# Patient Record
Sex: Male | Born: 1960 | Race: White | Hispanic: No | Smoking: Current some day smoker
Health system: Southern US, Community
[De-identification: ages and names within clinical notes are randomized; demographics above are authoritative.]

## PROBLEM LIST (undated history)

## (undated) DIAGNOSIS — T8459XA Infection and inflammatory reaction due to other internal joint prosthesis, initial encounter: Secondary | ICD-10-CM

## (undated) DIAGNOSIS — E785 Hyperlipidemia, unspecified: Secondary | ICD-10-CM

## (undated) DIAGNOSIS — K635 Polyp of colon: Secondary | ICD-10-CM

## (undated) DIAGNOSIS — I712 Thoracic aortic aneurysm, without rupture, unspecified: Secondary | ICD-10-CM

## (undated) DIAGNOSIS — I10 Essential (primary) hypertension: Secondary | ICD-10-CM

## (undated) DIAGNOSIS — E0590 Thyrotoxicosis, unspecified without thyrotoxic crisis or storm: Secondary | ICD-10-CM

## (undated) DIAGNOSIS — E119 Type 2 diabetes mellitus without complications: Secondary | ICD-10-CM

## (undated) DIAGNOSIS — I493 Ventricular premature depolarization: Secondary | ICD-10-CM

## (undated) DIAGNOSIS — K219 Gastro-esophageal reflux disease without esophagitis: Secondary | ICD-10-CM

## (undated) DIAGNOSIS — M1712 Unilateral primary osteoarthritis, left knee: Secondary | ICD-10-CM

## (undated) DIAGNOSIS — Z96659 Presence of unspecified artificial knee joint: Secondary | ICD-10-CM

## (undated) HISTORY — DX: Type 2 diabetes mellitus without complications: E11.9

## (undated) HISTORY — DX: Thyrotoxicosis, unspecified without thyrotoxic crisis or storm: E05.90

## (undated) HISTORY — PX: COLONOSCOPY: SHX174

## (undated) HISTORY — DX: Essential (primary) hypertension: I10

## (undated) HISTORY — DX: Polyp of colon: K63.5

## (undated) HISTORY — DX: Hyperlipidemia, unspecified: E78.5

---

## 2000-02-25 HISTORY — PX: KNEE ARTHROSCOPY W/ MEDIAL COLLATERAL LIGAMENT (MCL) REPAIR: SHX1876

## 2007-07-26 ENCOUNTER — Ambulatory Visit: Payer: Self-pay | Admitting: Vascular Surgery

## 2007-11-23 ENCOUNTER — Ambulatory Visit: Payer: Self-pay | Admitting: Vascular Surgery

## 2007-12-27 ENCOUNTER — Ambulatory Visit: Payer: Self-pay | Admitting: Vascular Surgery

## 2008-01-04 ENCOUNTER — Ambulatory Visit: Payer: Self-pay | Admitting: Vascular Surgery

## 2010-07-09 NOTE — Assessment & Plan Note (Signed)
OFFICE VISIT   DRYDEN, TAPLEY  DOB:  06/29/1960                                       01/04/2008  [id number]#:[id number]   The patient had laser ablation of the right great saphenous vein with  multiple stab phlebectomies 1 week ago for painful varicosities.  He has  taken his ibuprofen and worn the elastic stocking as instructed.  He has  had some tenderness over the distal saphenous vein in the thigh as one  would expect with some erythema and itching which he has noted more over  the last few days.  He states it may have been present even prior to  that.  He did discontinue his ibuprofen and it seems to have aggravated  the symptoms.   PHYSICAL EXAMINATION:  On examination he has some mild erythema over the  mid to distal thigh over the saphenous ablation site.  The cord is  palpable in the great saphenous vein where it has been occluded.  There  is no distal edema and the stab phlebectomy wounds look good.   The ultrasound reveals no evidence of deep venous thrombosis with total  occlusion of the distal insertion site to near the saphenofemoral  junction on the right side.   He will take Keflex 500 mg t.i.d. for 1 week, continue ibuprofen for 4-5  more days.  If his symptoms worsen he will be in touch with me otherwise  we will see him back on a p.r.n. basis.   Quita Skye Hart [place], M.D.  Electronically Signed   JDL/MEDQ  D:  01/04/2008  T:  01/05/2008  Job:  [id number]

## 2010-07-09 NOTE — Procedures (Signed)
LOWER EXTREMITY VENOUS REFLUX EXAM   INDICATION:  Right leg varicose vein with edema and pain.   EXAM:  Using color-flow imaging and pulse Doppler spectral analysis, the  right common femoral, superficial femoral, popliteal, posterior tibial,  greater and lesser saphenous veins are evaluated.  There is no evidence  suggesting deep venous insufficiency in the right lower extremity.   The right saphenofemoral junction is not competent.  The right GSV is  not competent with the caliber as described below.   The right proximal short saphenous vein demonstrates competency.   GSV Diameter (used if found to be incompetent only)                                            Right    Left  Proximal Greater Saphenous Vein           0.86 cm  cm  Proximal-to-mid-thigh                     0.86 cm  cm  Mid thigh                                 0.68 cm  cm  Mid-distal thigh                          0.68 cm  cm  Distal thigh                              0.57 cm  cm  Knee                                      0.46 cm  cm    IMPRESSION:  1. Right greater saphenous vein reflux is identified with the caliber      ranging from 0.46 cm to 0.95 cm knee to groin.  2. The right greater saphenous vein is not aneurysmal.  3. The right greater saphenous vein is not tortuous.  4. The deep venous system is competent.  5. The right lesser saphenous vein is competent.  6. No evidence of deep venous thrombosis noted in the right leg.     ___________________________________________  Quita Skye. Hart [place], M.D.   MG/MEDQ  D:  07/26/2007  T:  07/26/2007  Job:  [id number]

## 2010-07-09 NOTE — Procedures (Signed)
DUPLEX DEEP VENOUS EXAM - LOWER EXTREMITY   INDICATION:  Follow up right greater saphenous vein laser ablation.   HISTORY:  Edema:  No.  Trauma/Surgery:  Right greater saphenous vein laser ablation on  12/27/2007.  Pain:  Right medial thigh tenderness.  PE:  No.  Previous DVT:  No.  Anticoagulants:  Other:   DUPLEX EXAM:                CFV   SFV   PopV  PTV    GSV                R  L  R  L  R  L  R   L  R  L  Thrombosis    o  o  o     o     o      +  Spontaneous   +  +  +     +     +      0  Phasic        +  +  +     +     +      0  Augmentation  +  +  +     +     +      0  Compressible  +  +  +     +     +      0  Competent     +           +            0   Legend:  + - yes  o - no  p - partial  D - decreased   IMPRESSION:  1. No evidence of deep venous thrombosis noted in the right lower      extremity.  2. The right greater saphenous vein is totally occluded from the      distal insertion site to the lateral accessory saphenous vein      branch.  3. Reflux is noted at the right saphenofemoral junction level but no      reflux is noted in the lateral accessory saphenous vein.    _____________________________  Quita Skye. Hart [place], M.D.   CH/MEDQ  D:  01/04/2008  T:  01/04/2008  Job:  [id number]

## 2010-07-09 NOTE — Assessment & Plan Note (Signed)
OFFICE VISIT   Johnathan Barr, Johnathan Barr  DOB:  Mar 24, 1960                                       11/23/2007  [id number]#:[id number]   The patient returns today for further evaluation of his severe venous  instructions of the right leg.  This gentleman has gross reflux in his  right great saphenous vein and saphenofemoral junction throughout with  diffuse swelling of the right leg, hyperpigmentation, bulging  varicosities and pain.  This has not been relieved by compression  stockings which he has worn for the last 3 months.  He has also tried  elevation and ibuprofen on a regular basis without success.  He has no  symptoms in the contralateral left leg.   This is affecting his daily living as well as his ability to work at  [employer]  I believe we should proceed with laser ablation of his right  great saphenous vein with multiple stab phlebectomies in the distal  thigh and calf to relieve his symptoms of severe venous insufficiency.  We will proceed with precertification for the procedure to be done in  the near future.   Quita Skye Hart [place], M.D.  Electronically Signed   JDL/MEDQ  D:  11/23/2007  T:  11/24/2007  Job:  [id number]

## 2010-07-09 NOTE — Consult Note (Signed)
VASCULAR SURGERY CONSULTATION   Johnathan Barr, Johnathan Barr  DOB:  [birth year]                                       07/26/2007  [id number]#:[id number]   The patient was referred by Dr. Lenise Arena for vascular surgery consultation  regarding severe venous insufficiency of the right lower extremity.  This healthy 50 year old gentleman has been noting increasing swelling  in the right calf compared to the left side over the last 4 years.  He  has no history of deep venous thrombosis, thrombophlebitis, pulmonary  emboli or other clotting problems.  He has had some easy bruisability in  the right leg, particularly in the pretibial region with evidence of  some darkening of the skin, which has occurred over the last 3 to 4  years.  Has not worn elastic compression stockings, but does take  occasional Aleve for heaviness and aching in the leg as the day  progresses.  This is affecting his ability to work at [employer]  He had  no bleeding ulceration or other complications.   PAST MEDICAL HISTORY:  Negative for hypertension, diabetes, coronary  artery disease, COPD or stroke.   PREVIOUS SURGERY:  Includes arthroscopic surgery in the left knee for  meniscus tear.   FAMILY HISTORY:  Positive for diabetes in mother, coronary artery  disease in father and sister.  Negative for stroke.   SOCIAL HISTORY:  Single, has three children, works in Optician, dispensing at  [employer]  He has smoked off and on in the past but most recently  stopped in March 2009.  Drinks occasional alcohol.   REVIEW OF SYSTEMS:  Unremarkable with the exception of some recent  weight gain, diffuse arthritis and joint pain, otherwise negative  cardiac, pulmonary, GI, GU, and vascular.   ALLERGIES:  None known.   MEDICATIONS:  Chantix 1 mg two times a day.   PHYSICAL EXAM:  Blood pressure 154/90, heart rate 60, respirations 14.  Generally healthy-appearing middle-aged male in no apparent distress,  alert and oriented x3.   Neck:  Supple 3+ carotid pulses palpable.  No  bruits are audible.  Neurologic:  Normal with no palpable adenopathy in  the neck.  Chest:  Clear to auscultation.  Cardiovascular:  Regular  rhythm with no murmurs.  Abdomen:  Soft, nontender, no palpable masses.  He has 3+ femoral popliteal, dorsalis pedis and posterior tibial pulses  bilaterally.  Right leg is obviously swollen compared to the left with 2  cm of increased circumference in the calf and ankle area.  There are  areas of hyperpigmentation in the pretibial region on the right  measuring up to 3 cm in diameter with no active ulceration.  There are  palpable varicosities in the right distal thigh and medial calf over the  greater saphenous system.  No active ulcerations noted.  Left leg is  free of varicosities or edema.   Venous duplex exam performed at [facility] today reveals gross reflux right  greater saphenous vein from the saphenofemoral junction to the knee with  no reflux in the right small saphenous vein and no evidence of deep  venous insufficiency.   He does have severe venous insufficiency of his greater saphenous system  which is causing significant symptoms affecting his daily living and  ability to work.  We will treat him with elastic compression stockings  (20 mm -  30 mm long-leg) as well as elevation, analgesics and see how  this affects his symptoms.  He will return in 3 months for further  evaluation.  If this is not successful he would be a good candidate for  laser ablation of the right greater saphenous vein was up with stab  phlebectomy in the distal thigh and proximal calf.   Quita Skye Hart Rochester, M.D.  Electronically Signed  JDL/MEDQ  D:  07/26/2007  T:  07/26/2007  Job:  [id number]   cc:   Joycelyn Rua, M.D.  Sherre Lain, NP

## 2011-08-29 ENCOUNTER — Encounter: Payer: Self-pay | Admitting: Nurse Practitioner

## 2011-08-29 ENCOUNTER — Ambulatory Visit (INDEPENDENT_AMBULATORY_CARE_PROVIDER_SITE_OTHER): Payer: Managed Care, Other (non HMO) | Admitting: Nurse Practitioner

## 2011-08-29 VITALS — BP 160/100 | HR 71 | Ht 76.0 in | Wt 265.1 lb

## 2011-08-29 DIAGNOSIS — K219 Gastro-esophageal reflux disease without esophagitis: Secondary | ICD-10-CM | POA: Insufficient documentation

## 2011-08-29 DIAGNOSIS — R140 Abdominal distension (gaseous): Secondary | ICD-10-CM | POA: Insufficient documentation

## 2011-08-29 DIAGNOSIS — E739 Lactose intolerance, unspecified: Secondary | ICD-10-CM | POA: Insufficient documentation

## 2011-08-29 DIAGNOSIS — I10 Essential (primary) hypertension: Secondary | ICD-10-CM | POA: Insufficient documentation

## 2011-08-29 DIAGNOSIS — R141 Gas pain: Secondary | ICD-10-CM

## 2011-08-29 DIAGNOSIS — K648 Other hemorrhoids: Secondary | ICD-10-CM

## 2011-08-29 DIAGNOSIS — R030 Elevated blood-pressure reading, without diagnosis of hypertension: Secondary | ICD-10-CM

## 2011-08-29 MED ORDER — HYDROCORTISONE ACETATE 25 MG RE SUPP: 25.0000 mg | Freq: Two times a day (BID) | RECTAL | Status: AC

## 2011-08-29 NOTE — Patient Instructions (Addendum)
Keep stools soft, Colace stool softner samples given. Call us in 10 days with a progress report.

## 2011-08-29 NOTE — Progress Notes (Signed)
08/29/2011 Johnathan Barr [id number] 11-25-1960   HISTORY OF PRESENT ILLNESS: Patient is a 51 year old male, new to this practice, here for evaluation of rectal bleeding. Patient was formally under the care of [facility]. He is switching to [facility] and [facility] as he was unhappy with care at [facility].   Patient's painless rectal bleeding began on Monday, 4 days ago. He has been having 1 to 2 episodes of painless rectal bleeding a day. The blood is bright red, he sees it in the toilet and on the tissue paper. Patient brings in a picture on his cell phone. No constipation or diarrhea. He has some mild nausea, bloating and gas. Patient has a history of lactose intolerance. He describes aforementioned symptoms as what he typically experiences with dairy products though he has not been consuming any.   His weight is stable, maybe up a few pounds as he is trying to stop smoking.  No other GI complaints. Patient has long-standing history of GERD. His symptoms are nicely controlled on once daily Protonix before meals.   Past Medical History  Diagnosis Date  . Hyperglycemia   . Colon polyps        Past Surgical History  Procedure Date  . Anterior cruciate ligament repair     right    reports that he has been smoking.  He has never used smokeless tobacco. He reports that he does not drink alcohol or use illicit drugs. family history includes Heart disease in his father; Lung cancer in his paternal uncle; Lymphoma in his cousin; and Ovarian cancer in his mother. No Known Allergies    Outpatient Encounter Prescriptions as of 08/29/2011  Medication Sig Dispense Refill  . buPROPion (WELLBUTRIN XL) 150 MG 24 hr tablet Take 150 mg by mouth daily.      . pantoprazole (PROTONIX) 20 MG tablet Take 20 mg by mouth daily.         REVIEW OF SYSTEMS  : Positive for itching .All other systems reviewed and negative except where noted in the History of Present Illness.   PHYSICAL EXAM: BP 160/100   Pulse 71  Ht 6\' 4"  (1.93 m)  Wt 265 lb 1.6 oz (120.249 kg)  BMI 32.27 kg/m2  SpO2 99% General: Well developed white male in no acute distress Head: Normocephalic and atraumatic Eyes:  sclerae anicteric,conjunctive pink. Ears: Normal auditory acuity Mouth: No deformity or lesions Neck: Supple, no masses.  Lungs: Clear throughout to auscultation Heart: Regular rate and rhythm Abdomen: Soft, non distended, nontender. No masses or hepatomegaly noted. Normal Bowel sounds Rectal: No external lesions. On anoscopy there were inflamed, friable internal hemorrhoids. Musculoskeletal: Symmetrical with no gross deformities  Skin: No lesions on visible extremities Extremities: No edema or deformities noted Neurological: Alert oriented x 4, grossly nonfocal Cervical Nodes:  No significant cervical adenopathy Psychological:  Alert and cooperative. Normal mood and affect  ASSESSMENT AND PLAN; 7. 56. 51 year old white male with small volume, painless rectal bleeding over the last 4 days. Will check a CBC. I have requested upper endoscopy and colonoscopy reports from [facility] done 3 years ago. Bleeding is likely to internal hemorrhoids seen on anoscopy today. Will treat with Anusol-HC suppositories. Patient will call in 7-10 days with a condition update. He will follow up with Dr. Arlyce Dice in a few weeks. EGD and colonoscopy reports can be reviewed at that time.  2. GERD, long-standing. Awaiting 2010 EGD report from [facility]. Continue daily protonix as he is doing well  on once daily dosing. 3. Lactose intolerance with bloating. 4. Bloating with excessive gas. Patient has not been consuming any dairy products lately. Etiology unclear, may be small bowel bacterial overgrowth. Will give him 14 days of Align to try 5. Elevated blood pressure without history of hypertension. His diastolic blood pressure was 100 on arrival. A recheck blood pressure check after patient had sat for 5-10 minutes was still elevated at  142/100. Patient denies history of hypertension but aware of elevated BP today. Will defer to PCP.

## 2011-09-01 ENCOUNTER — Telehealth: Payer: Self-pay | Admitting: *Deleted

## 2011-09-01 NOTE — Telephone Encounter (Signed)
I called the home and mobile number we have on file, has been disconnected.  I called his work, [employer] applications and [employer] for the pt on his extension.  I advised him we made him a follow up appt with Dr. Rob Bunting for 09-30-2011 at 10:45 AM.  I told him I am waiting for records from [facility].  I advised on the message for him to call me if he has any questions about the appointment.

## 2011-09-01 NOTE — Progress Notes (Signed)
i agree with the plan outlined in this note 

## 2011-09-01 NOTE — Telephone Encounter (Signed)
The patient called me back and told me his home and cell # are the same and the area code is 248, not 336.  I apologized for not calling the correct area code.  He didn't think he needs to see Dr Gerilyn Pilgrim in a month as Gunnar Fusi suggeseted.  He is doing okay now.  I told him once we get his procedures and path reports from [facility] we will scan them.

## 2011-09-08 ENCOUNTER — Telehealth: Payer: Self-pay | Admitting: *Deleted

## 2011-09-08 NOTE — Telephone Encounter (Signed)
Johnathan Barr from [facility] told me to call [employer] at [phone]. I was not able to talk to anyone. I had to leave the pt's name, DOB, Info on office pt was seen at.  I left this information and asked someone to please call me to inform me when I can expect the records . I advised I need them faxed to me at [phone], to my attention.

## 2011-09-16 ENCOUNTER — Telehealth: Payer: Self-pay | Admitting: Nurse Practitioner

## 2011-09-16 NOTE — Telephone Encounter (Signed)
Received 5 pages from [facility]. Sent to Dr. Wilmon Pali. 09/16/11/SD

## 2011-09-17 NOTE — Telephone Encounter (Signed)
We did receive the records.  I gave them to Willette Cluster ACNP to review. Received approximately 7-15.

## 2011-09-30 ENCOUNTER — Ambulatory Visit: Payer: Managed Care, Other (non HMO) | Admitting: Gastroenterology

## 2011-11-18 ENCOUNTER — Ambulatory Visit (INDEPENDENT_AMBULATORY_CARE_PROVIDER_SITE_OTHER): Payer: Managed Care, Other (non HMO) | Admitting: Sports Medicine

## 2011-11-18 ENCOUNTER — Encounter: Payer: Self-pay | Admitting: Sports Medicine

## 2011-11-18 VITALS — BP 149/92 | HR 72 | Wt 264.0 lb

## 2011-11-18 DIAGNOSIS — R030 Elevated blood-pressure reading, without diagnosis of hypertension: Secondary | ICD-10-CM

## 2011-11-18 DIAGNOSIS — Q667 Congenital pes cavus, unspecified foot: Secondary | ICD-10-CM

## 2011-11-18 DIAGNOSIS — B351 Tinea unguium: Secondary | ICD-10-CM

## 2011-11-18 DIAGNOSIS — Z299 Encounter for prophylactic measures, unspecified: Secondary | ICD-10-CM

## 2011-11-18 DIAGNOSIS — Z23 Encounter for immunization: Secondary | ICD-10-CM

## 2011-11-18 DIAGNOSIS — M7750 Other enthesopathy of unspecified foot: Secondary | ICD-10-CM

## 2011-11-18 DIAGNOSIS — Z0000 Encounter for general adult medical examination without abnormal findings: Secondary | ICD-10-CM | POA: Insufficient documentation

## 2011-11-18 DIAGNOSIS — E785 Hyperlipidemia, unspecified: Secondary | ICD-10-CM

## 2011-11-18 DIAGNOSIS — IMO0001 Reserved for inherently not codable concepts without codable children: Secondary | ICD-10-CM

## 2011-11-18 DIAGNOSIS — M7670 Peroneal tendinitis, unspecified leg: Secondary | ICD-10-CM | POA: Insufficient documentation

## 2011-11-18 MED ORDER — TERBINAFINE HCL 250 MG PO TABS: 250.0000 mg | ORAL_TABLET | Freq: Every day | ORAL | Status: DC

## 2011-11-18 MED ORDER — MELOXICAM 15 MG PO TABS: ORAL_TABLET | ORAL | Status: DC

## 2011-11-18 NOTE — Assessment & Plan Note (Signed)
Checking fasting lipids, CMET, TSH. Recheck this at the next visit, and institute blood pressure medication if still elevated.

## 2011-11-18 NOTE — Assessment & Plan Note (Signed)
Failed over-the-counter as. Lamisil for 3 months. We'll check liver function first.

## 2011-11-18 NOTE — Assessment & Plan Note (Signed)
TSH. Testosterone levels.

## 2011-11-18 NOTE — Assessment & Plan Note (Signed)
Mobic. Home rehabilitation. Lateral heel wedges. Consider custom orthotics a good response with lateral heel wedges for 4 weeks. If no better can consider ultrasound-guided peroneal tendon sheath injection.

## 2011-11-18 NOTE — Assessment & Plan Note (Signed)
Mobic. Home rehabilitation. Lateral heel wedges. Consider custom orthotics a good response with lateral heel wedges for 4 weeks. If no better can consider ultrasound-guided peroneal tendon sheath injection. 

## 2011-11-18 NOTE — Progress Notes (Signed)
Subjective:    CC: Establish care.   HPI:  Bilateral ankle pain: Present for years. He localizes the pain over the posterior aspect of the lateral malleolus. He denies any injury. Occasionally they swell. At this point, he is using 2 naproxen 4 times per day.  His previous physicians and been unable to help him with the severe pain.  Onychomycosis: Present for long period time, years. He has tried topical antifungal's, none of which have been efficacious.  Elevated blood pressure: He does not care he diagnosis of hypertension. He has never been on blood pressure medication.  Preventive care: Would like lipids, metabolic panel, thyroid, and testosterone checked.  Past medical history, Surgical history, Family history, Social history, Allergies, and medications have been entered into the medical record, reviewed, and no changes needed.   Review of Systems: No headache, visual changes, nausea, vomiting, diarrhea, constipation, dizziness, abdominal pain, skin rash, fevers, chills, night sweats, weight loss, chest pain, body aches, joint swelling, muscle aches, or shortness of breath.   Objective:    General: Well Developed, well nourished, and in no acute distress.  Neuro: Alert and oriented x3, extra-ocular muscles intact.  HEENT: Normocephalic, atraumatic, pupils equal round reactive to light, neck supple, no masses, no lymphadenopathy, thyroid nonpalpable.  Skin: Warm and dry, no rashes noted.  Cardiac: Regular rate and rhythm, no murmurs rubs or gallops.  Respiratory: Clear to auscultation bilaterally. Not using accessory muscles, speaking in full sentences.  Abdominal: Soft, nontender, nondistended, positive bowel sounds, no masses, no organomegaly.  Musculoskeletal: Shoulder, elbow, wrist, hip, knee, with full range of motion. Bilateral ankles: No visible erythema or swelling. Range of motion is full in all directions. Strength is 5/5 in all directions. Stable lateral and medial  ligaments; squeeze test and kleiger test unremarkable; Talar dome nontender; No pain at base of 5th MT; No tenderness over cuboid; No tenderness over N spot or navicular prominence There is discrete tenderness over the posterior aspect of the lateral malleolus on both sides. I can reproduce his pain with resisted eversion. No sign of peroneal tendon subluxations or tenderness to palpation Negative tarsal tunnel tinel's He has markedly flexible pes cavus bilaterally. He also walks with significant oversupination.  We placed lateral heel wedges in both shoes, he noted that his pain improved significantly.   Impression and Recommendations:

## 2011-11-28 LAB — LIPID PANEL
Cholesterol: 251 mg/dL — ABNORMAL HIGH (ref 0–200)
HDL: 34 mg/dL — ABNORMAL LOW (ref >39)
LDL Cholesterol: 185 mg/dL — ABNORMAL HIGH (ref 0–99)
Total CHOL/HDL Ratio: 7.4 ratio
Triglycerides: 159 mg/dL — ABNORMAL HIGH (ref <150)
VLDL: 32 mg/dL (ref 0–40)

## 2011-11-28 LAB — COMPREHENSIVE METABOLIC PANEL
Albumin: 4.7 g/dL (ref 3.5–5.2)
BUN: 18 mg/dL (ref 6–23)
CO2: 25 mEq/L (ref 19–32)
Calcium: 9.6 mg/dL (ref 8.4–10.5)
Chloride: 106 mEq/L (ref 96–112)
Glucose, Bld: 98 mg/dL (ref 70–99)
Potassium: 4.3 mEq/L (ref 3.5–5.3)

## 2011-11-28 LAB — TSH: TSH: 0.538 u[IU]/mL (ref 0.350–4.500)

## 2011-11-28 LAB — COMPREHENSIVE METABOLIC PANEL WITH GFR
ALT: 31 U/L (ref 0–53)
AST: 27 U/L (ref 0–37)
Alkaline Phosphatase: 75 U/L (ref 39–117)
Creat: 1.14 mg/dL (ref 0.50–1.35)
Sodium: 140 meq/L (ref 135–145)
Total Bilirubin: 0.6 mg/dL (ref 0.3–1.2)
Total Protein: 6.9 g/dL (ref 6.0–8.3)

## 2011-12-01 ENCOUNTER — Encounter: Payer: Self-pay | Admitting: Sports Medicine

## 2011-12-01 LAB — TESTOSTERONE, FREE, TOTAL, SHBG
Sex Hormone Binding: 27 nmol/L (ref 13–71)
Testosterone, Free: 93.9 pg/mL (ref 47.0–244.0)
Testosterone-% Free: 2.3 % (ref 1.6–2.9)
Testosterone: 411.78 ng/dL (ref 300–890)

## 2011-12-01 MED ORDER — ATORVASTATIN CALCIUM 40 MG PO TABS: 40.0000 mg | ORAL_TABLET | Freq: Every day | ORAL | Status: DC

## 2011-12-01 NOTE — Addendum Note (Signed)
Addended by: Monica Becton on: 12/01/2011 02:35 PM   Modules accepted: Orders

## 2011-12-15 ENCOUNTER — Ambulatory Visit (INDEPENDENT_AMBULATORY_CARE_PROVIDER_SITE_OTHER): Payer: Managed Care, Other (non HMO) | Admitting: Sports Medicine

## 2011-12-15 ENCOUNTER — Encounter: Payer: Self-pay | Admitting: *Deleted

## 2011-12-15 ENCOUNTER — Encounter: Payer: Self-pay | Admitting: Sports Medicine

## 2011-12-15 VITALS — BP 133/86 | HR 80 | Wt 258.0 lb

## 2011-12-15 DIAGNOSIS — M7750 Other enthesopathy of unspecified foot: Secondary | ICD-10-CM

## 2011-12-15 DIAGNOSIS — R269 Unspecified abnormalities of gait and mobility: Secondary | ICD-10-CM

## 2011-12-15 DIAGNOSIS — M7670 Peroneal tendinitis, unspecified leg: Secondary | ICD-10-CM

## 2011-12-15 DIAGNOSIS — Q667 Congenital pes cavus, unspecified foot: Secondary | ICD-10-CM

## 2011-12-15 NOTE — Assessment & Plan Note (Signed)
Custom orthotics as above. 

## 2011-12-15 NOTE — Progress Notes (Signed)
Subjective:    CC: Followup bilateral peroneal tendinitis  HPI:  Johnathan Barr comes back to see me approximately one month after his last visit. I diagnosed him with bilateral pes cavus, with abnormality of gait, and peroneal tendinitis. We placed him in a lateral wedge which resolved his pain. He also has been using Mobic. Unfortunately, these wedges have become compressed.  He no longer correct his over supination. His pain is returned, and he is desiring injection therapy, plus/minus custom orthotics.  He still localizes the pain behind the lateral malleolus on both sides. This is without radiation.  Past medical history, Surgical history, Family history, Social history, Allergies, and medications have been entered into the medical record, reviewed, and no changes needed.   Review of Systems: No headache, visual changes, nausea, vomiting, diarrhea, constipation, dizziness, abdominal pain, skin rash, fevers, chills, night sweats, weight loss, swollen lymph nodes, body aches, joint swelling, muscle aches, chest pain, or shortness of breath.   Objective:   Vitals:  Afebrile, vital signs stable. General: Well Developed, well nourished, and in no acute distress.  Neuro/Psych: Alert and oriented x3, extra-ocular muscles intact, able to move all 4 extremities.  Skin: Warm and dry, no rashes noted.  Respiratory: Not using accessory muscles, speaking in full sentences, trachea midline.  Cardiovascular: Pulses palpable, no extremity edema. Abdomen: Does not appear distended.  Bilateral pes cavus. Over supination with walking. Tender to palpation along the peroneal tendons. Reproduction of pain with resisted eversion.  Procedure: Real-time Ultrasound Guided Injection of bilateral peroneal tendon sheath. Device: GE Logiq E  Ultrasound guided injection is preferred based studies that show increased duration, increased effect, greater accuracy, decreased procedural pain, increased response rate, and decreased  cost with ultrasound guided versus blind injection.  Verbal informed consent obtained.  Time-out conducted.  Noted no overlying erythema, induration, or other signs of local infection.  Skin prepped in a sterile fashion.  Local anesthesia: Topical Ethyl chloride.  With sterile technique and under real time ultrasound guidance:  Needle advanced and short axis, 1 cc Kenalog 40, 4 cc lidocaine injected into the peroneal tendon sheath, which was seen distending.  A similar procedure was repeated on both sides. Completed without difficulty  Pain immediately resolved suggesting accurate placement of the medication.  Advised to call if fevers/chills, erythema, induration, drainage, or persistent bleeding.  Images permanently stored and available for review in the ultrasound unit.  Impression: Technically successful ultrasound guided injection.  Patient was fitted for a : standard, cushioned, semi-rigid orthotic. The orthotic was heated and afterward the patient stood on the orthotic blank positioned on the orthotic stand. The patient was positioned in subtalar neutral position and 10 degrees of ankle dorsiflexion in a weight bearing stance. After completion of molding, a stable base was applied to the orthotic blank. The blank was ground to a stable position for weight bearing. Size: 12 Base: Blue EVA Additional Posting and Padding: None, I did place him in his close to subtalar neutral as I could as he did have very significant over supination with pes cavus. The patient ambulated these, and they were very comfortable.  Impression and Recommendations:   This case required medical decision making of moderate complexity.

## 2011-12-15 NOTE — Assessment & Plan Note (Signed)
Ultrasound guided injection into both peroneal tendon sheaths as above. Custom orthotics. He will continue his home rehabilitation exercises, and continue Mobic. I will see him back in 4 weeks.

## 2011-12-16 ENCOUNTER — Ambulatory Visit: Payer: Managed Care, Other (non HMO) | Admitting: Sports Medicine

## 2012-01-14 ENCOUNTER — Ambulatory Visit: Payer: Managed Care, Other (non HMO) | Admitting: Sports Medicine

## 2012-01-16 ENCOUNTER — Encounter: Payer: Self-pay | Admitting: Sports Medicine

## 2012-01-16 ENCOUNTER — Ambulatory Visit (INDEPENDENT_AMBULATORY_CARE_PROVIDER_SITE_OTHER): Payer: Managed Care, Other (non HMO)

## 2012-01-16 ENCOUNTER — Ambulatory Visit (INDEPENDENT_AMBULATORY_CARE_PROVIDER_SITE_OTHER): Payer: Managed Care, Other (non HMO) | Admitting: Sports Medicine

## 2012-01-16 VITALS — BP 128/79 | HR 86 | Wt 263.0 lb

## 2012-01-16 DIAGNOSIS — M7670 Peroneal tendinitis, unspecified leg: Secondary | ICD-10-CM

## 2012-01-16 DIAGNOSIS — M7750 Other enthesopathy of unspecified foot: Secondary | ICD-10-CM

## 2012-01-16 DIAGNOSIS — M7730 Calcaneal spur, unspecified foot: Secondary | ICD-10-CM

## 2012-01-16 DIAGNOSIS — X58XXXA Exposure to other specified factors, initial encounter: Secondary | ICD-10-CM

## 2012-01-16 DIAGNOSIS — IMO0002 Reserved for concepts with insufficient information to code with codable children: Secondary | ICD-10-CM

## 2012-01-16 DIAGNOSIS — M25579 Pain in unspecified ankle and joints of unspecified foot: Secondary | ICD-10-CM

## 2012-01-16 NOTE — Assessment & Plan Note (Signed)
Left peroneal tendinitis resolved. Right-sided improved after injection for 2 weeks, the pain returned. I'm going to add a lateral post on his right orthotic. He will continue home rehabilitation. We will x-ray his right ankle. He will come back to see me in 3 weeks, and if no better we can consider a repeat guided injection into the peroneus brevis tendon sheath distal to the malleolus. Can also consider MRI of the ankle at that time.

## 2012-01-16 NOTE — Progress Notes (Signed)
SPORTS MEDICINE CONSULTATION REPORT  Subjective:    CC: Followup  HPI: Bilateral peroneal tendinitis: I've seen Johnathan Barr a few times. He has bilateral peroneal tendinitis with bilateral pes cavus and over supination. I injected both peroneal tendon sheaths at the last visit. Overall the pain in his left peroneal tendon has completely resolved. On the right side improved significantly and was resolved for 2 weeks, however fortunately his pain is returned. He still feels as though he's walking on the outside of his right foot. His orthotics continue to be comfortable. Pain is localized, does not radiate.  Past medical history, Surgical history, Family history, Social history, Allergies, and medications have been entered into the medical record, reviewed, and no changes needed.   Review of Systems: No headache, visual changes, nausea, vomiting, diarrhea, constipation, dizziness, abdominal pain, skin rash, fevers, chills, night sweats, weight loss, swollen lymph nodes, body aches, joint swelling, muscle aches, chest pain, or shortness of breath.   Objective:   Vitals:  Afebrile, vital signs stable. General: Well Developed, well nourished, and in no acute distress.  Neuro/Psych: Alert and oriented x3, extra-ocular muscles intact, able to move all 4 extremities.  Skin: Warm and dry, no rashes noted.  Respiratory: Not using accessory muscles, speaking in full sentences, trachea midline.  Cardiovascular: Pulses palpable, no extremity edema. Abdomen: Does not appear distended. Right Ankle: No visible erythema or swelling. Range of motion is full in all directions. Strength is 5/5 in all directions. Stable lateral and medial ligaments; squeeze test and kleiger test unremarkable; Talar dome nontender; No pain at base of 5th MT; No tenderness over cuboid; No tenderness over N spot or navicular prominence No tenderness on posterior aspects of lateral and medial malleolus There is some tenderness to  palpation on the peroneus brevis tendon just distal to the lateral malleolus. This pain is reproducible with resisted eversion of the foot. Negative tarsal tunnel tinel's Able to walk 4 steps.  His orthotics are in good shape. I added an additional piece of blue EVA as a lateral post under his right orthotic. He ambulated, and these were comfortable.  X-rays show no degenerative changes, he has a small broken piece of needle in the medial aspect of his right foot. This is old, and he was aware of this from the past.  Impression and Recommendations:   This case required medical decision making of moderate complexity.

## 2012-01-19 ENCOUNTER — Ambulatory Visit: Payer: Managed Care, Other (non HMO) | Admitting: Sports Medicine

## 2012-02-02 ENCOUNTER — Encounter: Payer: Self-pay | Admitting: Sports Medicine

## 2012-02-02 ENCOUNTER — Ambulatory Visit (INDEPENDENT_AMBULATORY_CARE_PROVIDER_SITE_OTHER): Payer: Managed Care, Other (non HMO) | Admitting: Sports Medicine

## 2012-02-02 VITALS — BP 150/91 | HR 74 | Wt 266.0 lb

## 2012-02-02 DIAGNOSIS — M7670 Peroneal tendinitis, unspecified leg: Secondary | ICD-10-CM

## 2012-02-02 DIAGNOSIS — M7750 Other enthesopathy of unspecified foot: Secondary | ICD-10-CM

## 2012-02-02 NOTE — Patient Instructions (Addendum)
3 sets of 30 internal straight leg raises.

## 2012-02-02 NOTE — Progress Notes (Signed)
SPORTS MEDICINE CONSULTATION REPORT  Subjective:    CC: Followup  HPI: Right peroneal tendinitis: This is actually been present bilaterally, we injected his peroneal tendon sheaths, making custom orthotics with lateral post in, and placed in an aggressive home rehabilitation. Overall he is pain free. He does note that when wearing an ASO he does feel significantly better. He does understand that he needs to work on rehabilitation aggressively.  Past medical history, Surgical history, Family history, Social history, Allergies, and medications have been entered into the medical record, reviewed, and no changes needed.   Review of Systems: No headache, visual changes, nausea, vomiting, diarrhea, constipation, dizziness, abdominal pain, skin rash, fevers, chills, night sweats, weight loss, swollen lymph nodes, body aches, joint swelling, muscle aches, chest pain, shortness of breath, mood changes, visual or auditory hallucinations.   Objective:   Vitals:  Afebrile, vital signs stable. General: Well Developed, well nourished, and in no acute distress.  Neuro/Psych: Alert and oriented x3, extra-ocular muscles intact, able to move all 4 extremities.  Skin: Warm and dry, no rashes noted.  Respiratory: Not using accessory muscles, speaking in full sentences, trachea midline.  Cardiovascular: Pulses palpable, no extremity edema. Abdomen: Does not appear distended. Right Ankle: No visible erythema or swelling. Range of motion is full in all directions. Strength is 5/5 in all directions. Still does have some pain with resisted eversion. Stable lateral and medial ligaments; squeeze test and kleiger test unremarkable; Talar dome nontender; No pain at base of 5th MT; No tenderness over cuboid; No tenderness over N spot or navicular prominence No tenderness on posterior aspects of lateral and medial malleolus No sign of peroneal tendon subluxations or tenderness to palpation Negative tarsal tunnel  tinel's Able to walk 4 steps. Positive internal straight leg raise with weak hip abductors on the right side when compared to the left.  Impression and Recommendations:   This case required medical decision making of moderate complexity.

## 2012-02-02 NOTE — Assessment & Plan Note (Addendum)
Pain free now with orthotics with lateral posting. Continue Home rehab. Return in 3 months.  Can always reinject into peroneus brevis sheath distal to malleolus if no better.

## 2012-05-03 ENCOUNTER — Ambulatory Visit: Payer: Managed Care, Other (non HMO) | Admitting: Sports Medicine

## 2012-05-14 ENCOUNTER — Ambulatory Visit (INDEPENDENT_AMBULATORY_CARE_PROVIDER_SITE_OTHER): Payer: Managed Care, Other (non HMO) | Admitting: Sports Medicine

## 2012-05-14 VITALS — BP 138/90 | HR 82 | Wt 258.0 lb

## 2012-05-14 DIAGNOSIS — E785 Hyperlipidemia, unspecified: Secondary | ICD-10-CM | POA: Insufficient documentation

## 2012-05-14 DIAGNOSIS — IMO0002 Reserved for concepts with insufficient information to code with codable children: Secondary | ICD-10-CM

## 2012-05-14 DIAGNOSIS — M5416 Radiculopathy, lumbar region: Secondary | ICD-10-CM | POA: Insufficient documentation

## 2012-05-14 DIAGNOSIS — B351 Tinea unguium: Secondary | ICD-10-CM

## 2012-05-14 MED ORDER — TERBINAFINE HCL 250 MG PO TABS: 250.0000 mg | ORAL_TABLET | Freq: Every day | ORAL | Status: DC

## 2012-05-14 MED ORDER — PREDNISONE 50 MG PO TABS: ORAL_TABLET | ORAL | Status: DC

## 2012-05-14 MED ORDER — GABAPENTIN 300 MG PO CAPS: ORAL_CAPSULE | ORAL | Status: DC

## 2012-05-14 NOTE — Assessment & Plan Note (Signed)
We will start conservatively with prednisone, gabapentin. Home exercises, he should also continue with his chiropractor. X-rays. Return in 4 weeks, if no better we can certainly consider MRI. He should continue his coconut oil.

## 2012-05-14 NOTE — Progress Notes (Signed)
Subjective:    CC: Back pain  HPI: This is a 52 year-old gentleman who presents with aching back pain located at the left buttock radiating down the left posterior thigh. The pain does not move down past the knee. He reports a history of an "L5, S1 disc herniation." The pain is worse with long car rides but does not worsen with coughing. He has taken tramadol, chondroitin supplements, and coconut oil supplements and says these help his pain. He denies numbness, tingling, and loss of bowel or bladder function.  Past medical history, Surgical history, Family history not pertinant except as noted below, Social history, Allergies, and medications have been entered into the medical record, reviewed, and no changes needed.   Review of Systems: No fevers, chills, night sweats, weight loss, chest pain, or shortness of breath.   Objective:    General: Well Developed, well nourished, and in no acute distress.  Neuro: Alert and oriented x3, extra-ocular muscles intact, sensation grossly intact.  HEENT: Normocephalic, atraumatic, pupils equal round reactive to light, neck supple, no masses, no lymphadenopathy, thyroid nonpalpable.  Skin: Warm and dry, no rashes. Cardiac: Regular rate and rhythm, no murmurs rubs or gallops.  Respiratory: Clear to auscultation bilaterally. Not using accessory muscles, speaking in full sentences. Back Exam:  Inspection: Unremarkable  Motion: Flexion 45 deg, Extension 45 deg, Side Bending to 45 deg bilaterally,   SLR laying: Negative   Palpable tenderness: None. Sensory change: Gross sensation intact to all lumbar and sacral dermatomes.  Reflexes: 2+ at both patellar tendons, 2+ at achilles tendons  Strength at foot  Plantar-flexion: 5/5 Dorsi-flexion: 5/5 Eversion: 5/5 Inversion: 5/5  Leg strength  Quad: 5/5 Hamstring: 5/5 Hip flexor: 5/5 Hip abductors: 5/5  Gait unremarkable.   Impression and Recommendations:

## 2012-05-14 NOTE — Assessment & Plan Note (Signed)
Rechecking lipids. 

## 2012-05-14 NOTE — Assessment & Plan Note (Signed)
Refill Lamisil.

## 2012-05-21 ENCOUNTER — Other Ambulatory Visit: Payer: Self-pay | Admitting: Sports Medicine

## 2012-05-21 ENCOUNTER — Ambulatory Visit (HOSPITAL_BASED_OUTPATIENT_CLINIC_OR_DEPARTMENT_OTHER)
Admission: RE | Admit: 2012-05-21 | Discharge: 2012-05-21 | Disposition: A | Discharge status: Home / Self Care | Payer: Managed Care, Other (non HMO) | Source: Ambulatory Visit | Attending: Sports Medicine | Admitting: Sports Medicine

## 2012-05-21 DIAGNOSIS — M542 Cervicalgia: Secondary | ICD-10-CM

## 2012-05-21 DIAGNOSIS — M545 Low back pain, unspecified: Secondary | ICD-10-CM | POA: Insufficient documentation

## 2012-05-21 DIAGNOSIS — M25519 Pain in unspecified shoulder: Secondary | ICD-10-CM | POA: Insufficient documentation

## 2012-05-21 DIAGNOSIS — M5416 Radiculopathy, lumbar region: Secondary | ICD-10-CM

## 2012-05-22 LAB — COMPREHENSIVE METABOLIC PANEL
AST: 34 U/L (ref 0–37)
BUN: 13 mg/dL (ref 6–23)
CO2: 26 mEq/L (ref 19–32)
Calcium: 9.2 mg/dL (ref 8.4–10.5)
Chloride: 104 mEq/L (ref 96–112)
Creat: 1.16 mg/dL (ref 0.50–1.35)

## 2012-05-22 LAB — COMPREHENSIVE METABOLIC PANEL WITH GFR
ALT: 39 U/L (ref 0–53)
Albumin: 4.5 g/dL (ref 3.5–5.2)
Alkaline Phosphatase: 82 U/L (ref 39–117)
Glucose, Bld: 95 mg/dL (ref 70–99)
Potassium: 4.2 meq/L (ref 3.5–5.3)
Sodium: 140 meq/L (ref 135–145)
Total Bilirubin: 0.6 mg/dL (ref 0.3–1.2)
Total Protein: 6.7 g/dL (ref 6.0–8.3)

## 2012-05-22 LAB — LIPID PANEL
Cholesterol: 174 mg/dL (ref 0–200)
HDL: 31 mg/dL — ABNORMAL LOW (ref >39)
LDL Cholesterol: 114 mg/dL — ABNORMAL HIGH (ref 0–99)
Total CHOL/HDL Ratio: 5.6 Ratio
Triglycerides: 145 mg/dL (ref <150)
VLDL: 29 mg/dL (ref 0–40)

## 2012-05-23 MED ORDER — ATORVASTATIN CALCIUM 80 MG PO TABS: 80.0000 mg | ORAL_TABLET | Freq: Every day | ORAL | Status: DC

## 2012-05-23 NOTE — Addendum Note (Signed)
Addended by: Monica Becton on: 05/23/2012 10:06 PM   Modules accepted: Orders

## 2012-06-14 ENCOUNTER — Encounter: Payer: Self-pay | Admitting: Sports Medicine

## 2012-06-14 ENCOUNTER — Ambulatory Visit (INDEPENDENT_AMBULATORY_CARE_PROVIDER_SITE_OTHER): Payer: Managed Care, Other (non HMO) | Admitting: Sports Medicine

## 2012-06-14 VITALS — BP 139/90 | HR 83

## 2012-06-14 DIAGNOSIS — M5416 Radiculopathy, lumbar region: Secondary | ICD-10-CM

## 2012-06-14 DIAGNOSIS — E785 Hyperlipidemia, unspecified: Secondary | ICD-10-CM

## 2012-06-14 DIAGNOSIS — IMO0002 Reserved for concepts with insufficient information to code with codable children: Secondary | ICD-10-CM

## 2012-06-14 DIAGNOSIS — M7670 Peroneal tendinitis, unspecified leg: Secondary | ICD-10-CM

## 2012-06-14 DIAGNOSIS — M7750 Other enthesopathy of unspecified foot: Secondary | ICD-10-CM

## 2012-06-14 NOTE — Assessment & Plan Note (Signed)
Lipids are fairly well controlled. He has self discontinued his Lipitor. Return in one year.

## 2012-06-14 NOTE — Assessment & Plan Note (Signed)
Resolved

## 2012-06-14 NOTE — Progress Notes (Signed)
  Subjective:    CC: Follow up  HPI: Hyperlipidemia: Well controlled, he has self discontinued Lipitor.  Peroneal tendinitis: Resolved with orthotics, and exercises.  Low back pain with radiculitis: Resolved with steroids, muscle relaxers, NSAIDs, and chiropractic care.  Past medical history, Surgical history, Family history not pertinant except as noted below, Social history, Allergies, and medications have been entered into the medical record, reviewed, and no changes needed.   Review of Systems: No fevers, chills, night sweats, weight loss, chest pain, or shortness of breath.   Objective:    General: Well Developed, well nourished, and in no acute distress.  Neuro: Alert and oriented x3, extra-ocular muscles intact, sensation grossly intact.  HEENT: Normocephalic, atraumatic, pupils equal round reactive to light, neck supple, no masses, no lymphadenopathy, thyroid nonpalpable.  Skin: Warm and dry, no rashes. Cardiac: Regular rate and rhythm, no murmurs rubs or gallops, no lower extremity edema.  Respiratory: Clear to auscultation bilaterally. Not using accessory muscles, speaking in full sentences. Impression and Recommendations:

## 2012-06-14 NOTE — Assessment & Plan Note (Signed)
Continues to be resolved with home exercises and orthotics.

## 2012-08-30 ENCOUNTER — Telehealth: Payer: Self-pay | Admitting: Sports Medicine

## 2012-08-30 DIAGNOSIS — Q667 Congenital pes cavus, unspecified foot: Secondary | ICD-10-CM

## 2012-08-30 DIAGNOSIS — B351 Tinea unguium: Secondary | ICD-10-CM

## 2012-08-30 MED ORDER — MELOXICAM 15 MG PO TABS: ORAL_TABLET | ORAL | Status: DC

## 2012-08-30 MED ORDER — TERBINAFINE HCL 250 MG PO TABS: 250.0000 mg | ORAL_TABLET | Freq: Every day | ORAL | Status: DC

## 2012-08-30 MED ORDER — BUPROPION HCL ER (XL) 150 MG PO TB24: 150.0000 mg | ORAL_TABLET | Freq: Every day | ORAL | Status: DC

## 2012-08-30 MED ORDER — PANTOPRAZOLE SODIUM 20 MG PO TBEC: 40.0000 mg | DELAYED_RELEASE_TABLET | Freq: Every day | ORAL | Status: DC

## 2012-08-30 MED ORDER — OXICONAZOLE NITRATE 1 % EX LOTN: TOPICAL_LOTION | CUTANEOUS | Status: DC

## 2012-08-30 NOTE — Telephone Encounter (Signed)
Patient needs refills on bupropion, pantoprazole and meloxicam.  Also, wanted to get Lamisil refill, if you don't mind.  He has been prescribed oxistat cream 1% before by another provider but wanted to see if you would authorize a refill for it too.  Wants meds called refilled at [facility] on [address]. thanks

## 2012-08-30 NOTE — Telephone Encounter (Signed)
All of these have been called in.

## 2013-03-22 ENCOUNTER — Ambulatory Visit (INDEPENDENT_AMBULATORY_CARE_PROVIDER_SITE_OTHER): Payer: Managed Care, Other (non HMO)

## 2013-03-22 ENCOUNTER — Encounter: Payer: Self-pay | Admitting: Sports Medicine

## 2013-03-22 ENCOUNTER — Ambulatory Visit (INDEPENDENT_AMBULATORY_CARE_PROVIDER_SITE_OTHER): Payer: Managed Care, Other (non HMO) | Admitting: Sports Medicine

## 2013-03-22 VITALS — BP 159/95 | HR 75 | Ht 72.0 in | Wt 278.0 lb

## 2013-03-22 DIAGNOSIS — M7042 Prepatellar bursitis, left knee: Secondary | ICD-10-CM | POA: Insufficient documentation

## 2013-03-22 DIAGNOSIS — M67919 Unspecified disorder of synovium and tendon, unspecified shoulder: Secondary | ICD-10-CM

## 2013-03-22 DIAGNOSIS — M75101 Unspecified rotator cuff tear or rupture of right shoulder, not specified as traumatic: Secondary | ICD-10-CM | POA: Insufficient documentation

## 2013-03-22 DIAGNOSIS — M25519 Pain in unspecified shoulder: Secondary | ICD-10-CM

## 2013-03-22 DIAGNOSIS — M719 Bursopathy, unspecified: Secondary | ICD-10-CM

## 2013-03-22 DIAGNOSIS — R29898 Other symptoms and signs involving the musculoskeletal system: Secondary | ICD-10-CM

## 2013-03-22 DIAGNOSIS — R2242 Localized swelling, mass and lump, left lower limb: Secondary | ICD-10-CM

## 2013-03-22 MED ORDER — CYCLOBENZAPRINE HCL 10 MG PO TABS: ORAL_TABLET | ORAL | Status: DC

## 2013-03-22 NOTE — Progress Notes (Signed)
  Subjective:    CC: Right shoulder pain  HPI: 4 months of pain, localized over the deltoid, worse with overhead activities, saw chiropractor who suspected latissimus dorsi tear. Fortunately pain has been improving however still wakes him at night and is moderate and persistent. No neck pain, no numbness or tingling in the hands.  Left knee mass : present for a long time, movable, minimally tender.  Past medical history, Surgical history, Family history not pertinant except as noted below, Social history, Allergies, and medications have been entered into the medical record, reviewed, and no changes needed.   Review of Systems: No fevers, chills, night sweats, weight loss, chest pain, or shortness of breath.   Objective:    General: Well Developed, well nourished, and in no acute distress.  Neuro: Alert and oriented x3, extra-ocular muscles intact, sensation grossly intact.  HEENT: Normocephalic, atraumatic, pupils equal round reactive to light, neck supple, no masses, no lymphadenopathy, thyroid nonpalpable.  Skin: Warm and dry, no rashes. Cardiac: Regular rate and rhythm, no murmurs rubs or gallops, no lower extremity edema.  Respiratory: Clear to auscultation bilaterally. Not using accessory muscles, speaking in full sentences. Right Shoulder: Inspection reveals no abnormalities, atrophy or asymmetry. Palpation is normal with no tenderness over AC joint or bicipital groove. ROM is full in all planes. Rotator cuff strength normal throughout. Positive Neer is, Hawkin's, Empty Can sign, strongly positive liftoff test. Speeds and Yergason's tests normal. No labral pathology noted with negative Obrien's, negative clunk and good stability. Normal scapular function observed. No painful arc and no drop arm sign. No apprehension sign Left knee: There is a palpable and movable mass that feels like a sebaceous cyst, it is the size of a marble.  Impression and Recommendations:

## 2013-03-22 NOTE — Assessment & Plan Note (Signed)
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 Formal physical therapy, x-rays, continue Mobic, patient does desire more Flexeril Avoid overhead activities in the gym. He will continue to see his chiropractor. I would like to see him back in one month, consider injection if no better.

## 2013-03-22 NOTE — Assessment & Plan Note (Signed)
I do think that this more likely represents a sebaceous cyst rather than a prepatellar bursitis. He will make an appointment with me for excision in the office.

## 2013-03-31 ENCOUNTER — Ambulatory Visit (INDEPENDENT_AMBULATORY_CARE_PROVIDER_SITE_OTHER): Payer: Managed Care, Other (non HMO) | Admitting: Sports Medicine

## 2013-03-31 ENCOUNTER — Encounter: Payer: Self-pay | Admitting: Sports Medicine

## 2013-03-31 VITALS — BP 153/92 | HR 83 | Ht 72.0 in | Wt 279.0 lb

## 2013-03-31 DIAGNOSIS — M7040 Prepatellar bursitis, unspecified knee: Secondary | ICD-10-CM

## 2013-03-31 DIAGNOSIS — R2242 Localized swelling, mass and lump, left lower limb: Secondary | ICD-10-CM

## 2013-03-31 MED ORDER — OXYCODONE-ACETAMINOPHEN 5-325 MG PO TABS: 1.0000 | ORAL_TABLET | Freq: Three times a day (TID) | ORAL | Status: DC | PRN

## 2013-03-31 NOTE — Progress Notes (Addendum)
  Procedure:  Excision of left anterior knee mass, final diagnosis prepatellar bursectomy. Risks, benefits, and alternatives explained and consent obtained. Time out conducted. Surface prepped with alcohol. 5cc lidocaine with epinephine infiltrated in a field block. Adequate anesthesia ensured. Area prepped and draped in a sterile fashion. Excision performed with: Palpated the well defined movable mass in the anterior knee, 5 cc of lidocaine with epinephrine was infiltrated into field, once adequate analgesia was ensured, a #11 blade was used to make a longitudinal incision over the mass, sharp and blunt dissection was carried down, once we encountered the fascia, this was also sharply incised, I was then able to remove the mass in entirety, this was the prepatellar bursa. Afterwards 4-0 Vicryl was used in a horizontal mattress to close the fascia. I then used additional 4-0 Vicryl in a running subcuticular fashion to close the incision, finally a 4-0 Ethilon horizontal mattress was used to decrease tension across the wound. Sterile dressing was applied. Hemostasis achieved. Pt stable.

## 2013-03-31 NOTE — Assessment & Plan Note (Addendum)
Prepatellar bursa excised in its entirety. Closure was complex, the fascia was closed, and a running subcuticular suture was used to close the skin, a supporting horizontal mattress suture was placed, this will need to be removed in 14 days. Out of work today. Oxycodone for pain.

## 2013-04-07 ENCOUNTER — Ambulatory Visit (INDEPENDENT_AMBULATORY_CARE_PROVIDER_SITE_OTHER): Payer: Managed Care, Other (non HMO) | Admitting: Physical Therapy

## 2013-04-07 DIAGNOSIS — M719 Bursopathy, unspecified: Secondary | ICD-10-CM

## 2013-04-07 DIAGNOSIS — M25619 Stiffness of unspecified shoulder, not elsewhere classified: Secondary | ICD-10-CM

## 2013-04-07 DIAGNOSIS — R293 Abnormal posture: Secondary | ICD-10-CM

## 2013-04-07 DIAGNOSIS — M6281 Muscle weakness (generalized): Secondary | ICD-10-CM

## 2013-04-07 DIAGNOSIS — M67919 Unspecified disorder of synovium and tendon, unspecified shoulder: Secondary | ICD-10-CM

## 2013-04-09 ENCOUNTER — Encounter: Payer: Self-pay | Admitting: Sports Medicine

## 2013-04-14 ENCOUNTER — Encounter: Payer: Self-pay | Admitting: Sports Medicine

## 2013-04-14 ENCOUNTER — Ambulatory Visit (INDEPENDENT_AMBULATORY_CARE_PROVIDER_SITE_OTHER): Payer: Managed Care, Other (non HMO) | Admitting: Sports Medicine

## 2013-04-14 VITALS — BP 161/97 | HR 77 | Wt 279.0 lb

## 2013-04-14 DIAGNOSIS — E785 Hyperlipidemia, unspecified: Secondary | ICD-10-CM

## 2013-04-14 DIAGNOSIS — IMO0001 Reserved for inherently not codable concepts without codable children: Secondary | ICD-10-CM

## 2013-04-14 DIAGNOSIS — Z299 Encounter for prophylactic measures, unspecified: Secondary | ICD-10-CM

## 2013-04-14 DIAGNOSIS — R030 Elevated blood-pressure reading, without diagnosis of hypertension: Secondary | ICD-10-CM

## 2013-04-14 DIAGNOSIS — M7042 Prepatellar bursitis, left knee: Secondary | ICD-10-CM

## 2013-04-14 NOTE — Assessment & Plan Note (Signed)
Checking routine bloodwork. 

## 2013-04-14 NOTE — Assessment & Plan Note (Signed)
6 days postop, supporting stitch removed today, running subcuticular suture and seems to be in place and wound is healing extremely well.

## 2013-04-14 NOTE — Progress Notes (Signed)
  Subjective:    CC: Followup  HPI: Johnathan RuizJohn is about one week post removal of his left prepatellar bursa, his sutures have been already removed by the nurse, he is pain-free, wound is healing well.  Preventive measure: Desires routine blood work.  Elevated blood pressure: No chest pain, headaches, visual changes, shortness of breath, tells me that he would like to try to decrease his blood pressure with diet and exercise until summertime.  Past medical history, Surgical history, Family history not pertinant except as noted below, Social history, Allergies, and medications have been entered into the medical record, reviewed, and no changes needed.   Review of Systems: No fevers, chills, night sweats, weight loss, chest pain, or shortness of breath.   Objective:    General: Well Developed, well nourished, and in no acute distress.  Neuro: Alert and oriented x3, extra-ocular muscles intact, sensation grossly intact.  HEENT: Normocephalic, atraumatic, pupils equal round reactive to light, neck supple, no masses, no lymphadenopathy, thyroid nonpalpable.  Skin: Warm and dry, no rashes. Incision over the left knee is clean, dry, and intact. Cardiac: Regular rate and rhythm, no murmurs rubs or gallops, no lower extremity edema.  Respiratory: Clear to auscultation bilaterally. Not using accessory muscles, speaking in full sentences.  Impression and Recommendations:

## 2013-04-14 NOTE — Assessment & Plan Note (Signed)
Continues to be elevated. He does desire to wait until the end of the summer before we consider treating.

## 2013-04-14 NOTE — Assessment & Plan Note (Signed)
Desires STD screening.

## 2013-04-22 ENCOUNTER — Encounter: Payer: Managed Care, Other (non HMO) | Admitting: Physical Therapy

## 2013-04-22 LAB — CBC
HCT: 43.2 % (ref 39.0–52.0)
Hemoglobin: 15.1 g/dL (ref 13.0–17.0)
MCH: 32.1 pg (ref 26.0–34.0)
MCHC: 35 g/dL (ref 30.0–36.0)
MCV: 91.9 fL (ref 78.0–100.0)
Platelets: 226 10*3/uL (ref 150–400)
RBC: 4.7 MIL/uL (ref 4.22–5.81)
RDW: 13.1 % (ref 11.5–15.5)
WBC: 5.9 10*3/uL (ref 4.0–10.5)

## 2013-04-22 LAB — HEMOGLOBIN A1C
Hgb A1c MFr Bld: 7.2 % — ABNORMAL HIGH (ref <5.7)
Mean Plasma Glucose: 160 mg/dL — ABNORMAL HIGH (ref <117)

## 2013-04-22 LAB — COMPREHENSIVE METABOLIC PANEL WITH GFR
Alkaline Phosphatase: 78 U/L (ref 39–117)
BUN: 19 mg/dL (ref 6–23)
Creat: 1.1 mg/dL (ref 0.50–1.35)
Glucose, Bld: 132 mg/dL — ABNORMAL HIGH (ref 70–99)
Sodium: 138 meq/L (ref 135–145)
Total Bilirubin: 0.6 mg/dL (ref 0.2–1.2)
Total Protein: 7.1 g/dL (ref 6.0–8.3)

## 2013-04-22 LAB — HEPATITIS PANEL, ACUTE
HCV Ab: NEGATIVE
Hep A IgM: NONREACTIVE
Hep B C IgM: NONREACTIVE
Hepatitis B Surface Ag: NEGATIVE

## 2013-04-22 LAB — TSH: TSH: 0.991 u[IU]/mL (ref 0.350–4.500)

## 2013-04-22 LAB — COMPREHENSIVE METABOLIC PANEL
ALT: 43 U/L (ref 0–53)
AST: 26 U/L (ref 0–37)
Albumin: 4.8 g/dL (ref 3.5–5.2)
CO2: 25 mEq/L (ref 19–32)
Calcium: 9.8 mg/dL (ref 8.4–10.5)
Chloride: 102 mEq/L (ref 96–112)
Potassium: 4.2 mEq/L (ref 3.5–5.3)

## 2013-04-22 LAB — LIPID PANEL
Cholesterol: 319 mg/dL — ABNORMAL HIGH (ref 0–200)
HDL: 33 mg/dL — ABNORMAL LOW (ref >39)
LDL Cholesterol: 228 mg/dL — ABNORMAL HIGH (ref 0–99)
Total CHOL/HDL Ratio: 9.7 Ratio
Triglycerides: 292 mg/dL — ABNORMAL HIGH (ref <150)
VLDL: 58 mg/dL — ABNORMAL HIGH (ref 0–40)

## 2013-04-22 LAB — RPR

## 2013-04-22 LAB — HIV ANTIBODY (ROUTINE TESTING W REFLEX): HIV: NONREACTIVE

## 2013-04-25 LAB — HSV(HERPES SMPLX)ABS-I+II(IGG+IGM)-BLD
HSV 1 Glycoprotein G Ab, IgG: 0.19 IV
HSV 2 Glycoprotein G Ab, IgG: <0.1 IV
Herpes Simplex Vrs I&II-IgM Ab (EIA): 0.36 INDEX

## 2013-04-26 ENCOUNTER — Encounter: Payer: Self-pay | Admitting: Sports Medicine

## 2013-04-26 DIAGNOSIS — E119 Type 2 diabetes mellitus without complications: Secondary | ICD-10-CM | POA: Insufficient documentation

## 2013-04-26 LAB — GC/CHLAMYDIA PROBE AMP, URINE

## 2013-04-28 ENCOUNTER — Encounter: Payer: Self-pay | Admitting: Sports Medicine

## 2013-04-28 ENCOUNTER — Ambulatory Visit (INDEPENDENT_AMBULATORY_CARE_PROVIDER_SITE_OTHER): Payer: Managed Care, Other (non HMO) | Admitting: Sports Medicine

## 2013-04-28 VITALS — BP 137/87 | HR 73 | Wt 275.0 lb

## 2013-04-28 DIAGNOSIS — I10 Essential (primary) hypertension: Secondary | ICD-10-CM

## 2013-04-28 DIAGNOSIS — E119 Type 2 diabetes mellitus without complications: Secondary | ICD-10-CM

## 2013-04-28 DIAGNOSIS — E785 Hyperlipidemia, unspecified: Secondary | ICD-10-CM

## 2013-04-28 MED ORDER — METFORMIN HCL ER (MOD) 1000 MG PO TB24
1000.0000 mg | ORAL_TABLET | Freq: Every day | ORAL | Status: DC
Start: 2013-04-28 — End: 2013-08-08

## 2013-04-28 NOTE — Assessment & Plan Note (Signed)
Extremely elevated, we can revisit this in the future per patient request.

## 2013-04-28 NOTE — Assessment & Plan Note (Signed)
Close to the acceptable range, we can revisit this in the future.

## 2013-04-28 NOTE — Assessment & Plan Note (Signed)
New-onset diabetes. Metformin extended release. He does not desire for me to treat anything else for now, we will recheck a hemoglobin A1c in 3 months.

## 2013-04-28 NOTE — Progress Notes (Signed)
  Subjective:    CC: Followup  HPI: Diabetes mellitus type 2: A recently diagnosed Johnathan Barr after his most recent hemoglobin A1c came back elevated. He is amenable to treating this but tells me he doesn't want any of his chronic medical problems yet. He wants to do one at a time.  Hyperlipidemia: Cholesterol came back elevated at greater than 300, Johnathan Barr has been resistant to starting a statin medication, and has failed dietary modification multiple times.  Hypertension: Improved, still resistant to adding additional medicine.  Past medical history, Surgical history, Family history not pertinant except as noted below, Social history, Allergies, and medications have been entered into the medical record, reviewed, and no changes needed.   Review of Systems: No fevers, chills, night sweats, weight loss, chest pain, or shortness of breath.   Objective:    General: Well Developed, well nourished, and in no acute distress.  Neuro: Alert and oriented x3, extra-ocular muscles intact, sensation grossly intact.  HEENT: Normocephalic, atraumatic, pupils equal round reactive to light, neck supple, no masses, no lymphadenopathy, thyroid nonpalpable.  Skin: Warm and dry, no rashes. Cardiac: Regular rate and rhythm, no murmurs rubs or gallops, no lower extremity edema.  Respiratory: Clear to auscultation bilaterally. Not using accessory muscles, speaking in full sentences.  Impression and Recommendations:

## 2013-06-02 ENCOUNTER — Other Ambulatory Visit: Payer: Self-pay

## 2013-06-17 ENCOUNTER — Ambulatory Visit (INDEPENDENT_AMBULATORY_CARE_PROVIDER_SITE_OTHER): Payer: Managed Care, Other (non HMO) | Admitting: Sports Medicine

## 2013-06-17 ENCOUNTER — Encounter: Payer: Self-pay | Admitting: Sports Medicine

## 2013-06-17 ENCOUNTER — Ambulatory Visit (INDEPENDENT_AMBULATORY_CARE_PROVIDER_SITE_OTHER): Payer: Managed Care, Other (non HMO)

## 2013-06-17 VITALS — BP 146/95 | HR 77 | Ht 72.0 in | Wt 279.0 lb

## 2013-06-17 DIAGNOSIS — E669 Obesity, unspecified: Secondary | ICD-10-CM | POA: Insufficient documentation

## 2013-06-17 DIAGNOSIS — M1710 Unilateral primary osteoarthritis, unspecified knee: Secondary | ICD-10-CM

## 2013-06-17 DIAGNOSIS — I10 Essential (primary) hypertension: Secondary | ICD-10-CM

## 2013-06-17 DIAGNOSIS — M898X9 Other specified disorders of bone, unspecified site: Secondary | ICD-10-CM

## 2013-06-17 DIAGNOSIS — M25562 Pain in left knee: Secondary | ICD-10-CM | POA: Insufficient documentation

## 2013-06-17 DIAGNOSIS — M25569 Pain in unspecified knee: Secondary | ICD-10-CM

## 2013-06-17 MED ORDER — PHENTERMINE HCL 37.5 MG PO CAPS: 37.5000 mg | ORAL_CAPSULE | ORAL | Status: DC

## 2013-06-17 MED ORDER — LISINOPRIL-HYDROCHLOROTHIAZIDE 10-12.5 MG PO TABS: 1.0000 | ORAL_TABLET | Freq: Every day | ORAL | Status: DC

## 2013-06-17 NOTE — Assessment & Plan Note (Signed)
Starting phentermine. Return in one month for a weight check and refills.

## 2013-06-17 NOTE — Progress Notes (Signed)
  Subjective:    CC: Left knee pain  HPI: For 6 months Johnathan Barr has had pain that he localizes in the posterior lateral joint line of his left knee, more recently he's had increasing pain, swelling, and tightness, as well as inability to bend the knee. He gets an occasional catching sensation, desires to proceed relatively conservatively. Symptoms are moderate, persistent. No new injuries.  Past medical history, Surgical history, Family history not pertinant except as noted below, Social history, Allergies, and medications have been entered into the medical record, reviewed, and no changes needed.   Review of Systems: No fevers, chills, night sweats, weight loss, chest pain, or shortness of breath.   Objective:    General: Well Developed, well nourished, and in no acute distress.  Neuro: Alert and oriented x3, extra-ocular muscles intact, sensation grossly intact.  HEENT: Normocephalic, atraumatic, pupils equal round reactive to light, neck supple, no masses, no lymphadenopathy, thyroid nonpalpable.  Skin: Warm and dry, no rashes. Cardiac: Regular rate and rhythm, no murmurs rubs or gallops, no lower extremity edema.  Respiratory: Clear to auscultation bilaterally. Not using accessory muscles, speaking in full sentences. Left Knee: Visible effusion with a fluid wave, but no palpable tenderness to palpation. No patellar tenderness, or condyle tenderness. ROM full in flexion and extension and lower leg rotation. Ligaments with solid consistent endpoints including ACL, PCL, LCL, MCL. Negative Mcmurray's, Apley's, and Thessalonian tests. Non painful patellar compression. Patellar glide without crepitus. Patellar and quadriceps tendons unremarkable. Hamstring and quadriceps strength is normal.   Procedure: Real-time Ultrasound Guided aspiration/Injection of left knee Device: GE Logiq E  Verbal informed consent obtained.  Time-out conducted.  Noted no overlying erythema, induration, or other  signs of local infection.  Skin prepped in a sterile fashion.  Local anesthesia: Topical Ethyl chloride.  With sterile technique and under real time ultrasound guidance:  17 cc of straw-colored fluid aspirated, syringe switched and 2 cc Kenalog 40, 4 cc lidocaine injected easily. Completed without difficulty  Pain immediately resolved suggesting accurate placement of the medication.  Advised to call if fevers/chills, erythema, induration, drainage, or persistent bleeding.  Images permanently stored and available for review in the ultrasound unit.  Impression: Technically successful ultrasound guided injection.  Impression and Recommendations:

## 2013-06-17 NOTE — Assessment & Plan Note (Signed)
I'm going to add lisinopril/hydrochlorothiazide.

## 2013-06-17 NOTE — Assessment & Plan Note (Signed)
Pain in the posterior lateral joint line, with effusion, this likely represents a degenerative meniscal tear. X-rays, knees aspirated and injected today. MRI. Avoid deep knee bending past 90. I will help him work on weight loss.

## 2013-06-20 ENCOUNTER — Telehealth: Payer: Self-pay | Admitting: *Deleted

## 2013-06-20 NOTE — Telephone Encounter (Signed)
No PA required for MRI LT Knee w/o contrast.  Misty Ahmad, LPN  

## 2013-06-25 ENCOUNTER — Ambulatory Visit (HOSPITAL_BASED_OUTPATIENT_CLINIC_OR_DEPARTMENT_OTHER)
Admission: RE | Admit: 2013-06-25 | Discharge: 2013-06-25 | Disposition: A | Discharge status: Home / Self Care | Payer: Managed Care, Other (non HMO) | Source: Ambulatory Visit | Attending: Sports Medicine | Admitting: Sports Medicine

## 2013-06-25 DIAGNOSIS — M7120 Synovial cyst of popliteal space [Baker], unspecified knee: Secondary | ICD-10-CM | POA: Insufficient documentation

## 2013-06-25 DIAGNOSIS — M1710 Unilateral primary osteoarthritis, unspecified knee: Secondary | ICD-10-CM | POA: Insufficient documentation

## 2013-06-25 DIAGNOSIS — M23305 Other meniscus derangements, unspecified medial meniscus, unspecified knee: Secondary | ICD-10-CM | POA: Insufficient documentation

## 2013-06-25 DIAGNOSIS — M25562 Pain in left knee: Secondary | ICD-10-CM

## 2013-06-25 DIAGNOSIS — M6580 Other synovitis and tenosynovitis, unspecified site: Secondary | ICD-10-CM | POA: Insufficient documentation

## 2013-06-25 DIAGNOSIS — M2350 Chronic instability of knee, unspecified knee: Secondary | ICD-10-CM | POA: Insufficient documentation

## 2013-07-11 ENCOUNTER — Encounter: Payer: Self-pay | Admitting: Sports Medicine

## 2013-07-11 ENCOUNTER — Ambulatory Visit: Payer: Managed Care, Other (non HMO) | Admitting: Sports Medicine

## 2013-07-11 ENCOUNTER — Ambulatory Visit (INDEPENDENT_AMBULATORY_CARE_PROVIDER_SITE_OTHER): Payer: Managed Care, Other (non HMO) | Admitting: Sports Medicine

## 2013-07-11 VITALS — BP 125/80 | HR 92 | Ht 72.0 in | Wt 264.0 lb

## 2013-07-11 DIAGNOSIS — E669 Obesity, unspecified: Secondary | ICD-10-CM

## 2013-07-11 DIAGNOSIS — I10 Essential (primary) hypertension: Secondary | ICD-10-CM

## 2013-07-11 DIAGNOSIS — M25569 Pain in unspecified knee: Secondary | ICD-10-CM

## 2013-07-11 DIAGNOSIS — M25562 Pain in left knee: Secondary | ICD-10-CM

## 2013-07-11 MED ORDER — PHENTERMINE HCL 37.5 MG PO CAPS: 37.5000 mg | ORAL_CAPSULE | ORAL | Status: DC

## 2013-07-11 MED ORDER — LISINOPRIL 20 MG PO TABS: 20.0000 mg | ORAL_TABLET | Freq: Every day | ORAL | Status: DC

## 2013-07-11 NOTE — Assessment & Plan Note (Signed)
Extremely well controlled unfortunately Johnathan RuizJohn is intolerant of the diuretic portion. Switching to lisinopril 20.

## 2013-07-11 NOTE — Progress Notes (Signed)
  Subjective:    CC: Follow up  HPI: Obesity: 15 pound weight loss since starting phentermine last month.  Hypertension: Well controlled with does not like the diuretic portion of his current medication, excessive urination and occasional cramping.  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  Past medical history, Surgical history, Family history not pertinant except as noted below, Social history, Allergies, and medications have been entered into the medical record, reviewed, and no changes needed.   Review of Systems: No fevers, chills, night sweats, weight loss, chest pain, or shortness of breath.   Objective:    General: Well Developed, well nourished, and in no acute distress.  Neuro: Alert and oriented x3, extra-ocular muscles intact, sensation grossly intact.  HEENT: Normocephalic, atraumatic, pupils equal round reactive to light, neck supple, no masses, no lymphadenopathy, thyroid nonpalpable.  Skin: Warm and dry, no rashes. Cardiac: Regular rate and rhythm, no murmurs rubs or gallops, no lower extremity edema.  Respiratory: Clear to auscultation bilaterally. Not using accessory muscles, speaking in full sentences.  Impression and Recommendations:

## 2013-07-11 NOTE — Assessment & Plan Note (Signed)
15 pound weight loss. Continue phentermine. Return in one month.

## 2013-07-11 NOTE — Assessment & Plan Note (Addendum)
Did very well, however with some continued pain, and intra-articular derangement as noted on MRI, I do think he is a candidate for a arthroscopy. Referral to Dr. Luiz BlareGraves.

## 2013-07-29 ENCOUNTER — Ambulatory Visit: Payer: Managed Care, Other (non HMO) | Admitting: Sports Medicine

## 2013-08-08 ENCOUNTER — Ambulatory Visit (INDEPENDENT_AMBULATORY_CARE_PROVIDER_SITE_OTHER): Payer: Managed Care, Other (non HMO) | Admitting: Sports Medicine

## 2013-08-08 ENCOUNTER — Other Ambulatory Visit: Payer: Self-pay | Admitting: Sports Medicine

## 2013-08-08 VITALS — BP 122/84 | HR 88 | Ht 72.0 in | Wt 259.0 lb

## 2013-08-08 DIAGNOSIS — E785 Hyperlipidemia, unspecified: Secondary | ICD-10-CM

## 2013-08-08 DIAGNOSIS — E119 Type 2 diabetes mellitus without complications: Secondary | ICD-10-CM

## 2013-08-08 DIAGNOSIS — E669 Obesity, unspecified: Secondary | ICD-10-CM

## 2013-08-08 DIAGNOSIS — I10 Essential (primary) hypertension: Secondary | ICD-10-CM

## 2013-08-08 MED ORDER — PHENTERMINE HCL 37.5 MG PO CAPS: 37.5000 mg | ORAL_CAPSULE | ORAL | Status: DC

## 2013-08-08 NOTE — Assessment & Plan Note (Signed)
Refilling phentermine, return in a month.

## 2013-08-08 NOTE — Progress Notes (Signed)
  Subjective:    CC: Followup  HPI: Hypertension: Well controlled  Diabetes mellitus type 2: Stable on metformin, weight loss.  Obesity: 20 pound weight loss so far on phentermine.  Hyperlipidemia: Stable on atorvastatin.  Knee osteoarthritis : Has followup with Dr. Luiz BlareGraves for consideration of total knee arthroplasty.  Past medical history, Surgical history, Family history not pertinant except as noted below, Social history, Allergies, and medications have been entered into the medical record, reviewed, and no changes needed.   Review of Systems: No fevers, chills, night sweats, weight loss, chest pain, or shortness of breath.   Objective:    General: Well Developed, well nourished, and in no acute distress.  Neuro: Alert and oriented x3, extra-ocular muscles intact, sensation grossly intact.  HEENT: Normocephalic, atraumatic, pupils equal round reactive to light, neck supple, no masses, no lymphadenopathy, thyroid nonpalpable.  Skin: Warm and dry, no rashes. Cardiac: Regular rate and rhythm, no murmurs rubs or gallops, no lower extremity edema.  Respiratory: Clear to auscultation bilaterally. Not using accessory muscles, speaking in full sentences.  Impression and Recommendations:

## 2013-08-08 NOTE — Assessment & Plan Note (Addendum)
Continue Lipitor. Rechecking a month  Lipids elevated, however improved from before, switching to rosuvastatin. Recheck lipids in 3 months.

## 2013-08-08 NOTE — Assessment & Plan Note (Signed)
Continue diet, metformin. Rechecking A1c a month.

## 2013-08-08 NOTE — Assessment & Plan Note (Signed)
Well controlled, no changes 

## 2013-08-30 LAB — COMPREHENSIVE METABOLIC PANEL
AST: 21 U/L (ref 0–37)
Albumin: 4.5 g/dL (ref 3.5–5.2)
Alkaline Phosphatase: 70 U/L (ref 39–117)
Glucose, Bld: 119 mg/dL — ABNORMAL HIGH (ref 70–99)
Sodium: 137 mEq/L (ref 135–145)
Total Bilirubin: 0.7 mg/dL (ref 0.2–1.2)
Total Protein: 7.1 g/dL (ref 6.0–8.3)

## 2013-08-30 LAB — COMPREHENSIVE METABOLIC PANEL WITH GFR
ALT: 22 U/L (ref 0–53)
BUN: 18 mg/dL (ref 6–23)
CO2: 27 meq/L (ref 19–32)
Calcium: 9.9 mg/dL (ref 8.4–10.5)
Chloride: 97 meq/L (ref 96–112)
Creat: 1.22 mg/dL (ref 0.50–1.35)
Potassium: 4.8 meq/L (ref 3.5–5.3)

## 2013-08-30 LAB — LIPID PANEL
Cholesterol: 239 mg/dL — ABNORMAL HIGH (ref 0–200)
HDL: 39 mg/dL — ABNORMAL LOW (ref >39)
LDL Cholesterol: 167 mg/dL — ABNORMAL HIGH (ref 0–99)
Total CHOL/HDL Ratio: 6.1 Ratio
Triglycerides: 164 mg/dL — ABNORMAL HIGH (ref <150)
VLDL: 33 mg/dL (ref 0–40)

## 2013-08-30 LAB — CBC
HCT: 43.3 % (ref 39.0–52.0)
Hemoglobin: 14.6 g/dL (ref 13.0–17.0)
MCH: 31.5 pg (ref 26.0–34.0)
MCHC: 33.7 g/dL (ref 30.0–36.0)
MCV: 93.5 fL (ref 78.0–100.0)
Platelets: 236 10*3/uL (ref 150–400)
RBC: 4.63 MIL/uL (ref 4.22–5.81)
RDW: 15.4 % (ref 11.5–15.5)
WBC: 8 10*3/uL (ref 4.0–10.5)

## 2013-08-30 LAB — H. PYLORI ANTIBODY, IGG: H Pylori IgG: <0.4 {ISR}

## 2013-08-30 LAB — TSH: TSH: 1.111 u[IU]/mL (ref 0.350–4.500)

## 2013-08-30 MED ORDER — ROSUVASTATIN CALCIUM 40 MG PO TABS: 40.0000 mg | ORAL_TABLET | Freq: Every day | ORAL | Status: DC

## 2013-08-30 NOTE — Addendum Note (Signed)
Addended by: Monica BectonHEKKEKANDAM, Ajene Carchi J on: 08/30/2013 07:11 PM   Modules accepted: Orders, Medications

## 2013-09-05 ENCOUNTER — Ambulatory Visit: Payer: Managed Care, Other (non HMO) | Admitting: Sports Medicine

## 2013-09-08 ENCOUNTER — Encounter: Payer: Self-pay | Admitting: Sports Medicine

## 2013-09-08 ENCOUNTER — Ambulatory Visit (INDEPENDENT_AMBULATORY_CARE_PROVIDER_SITE_OTHER): Payer: Managed Care, Other (non HMO) | Admitting: Sports Medicine

## 2013-09-08 VITALS — BP 138/88 | HR 83 | Ht 73.0 in | Wt 255.0 lb

## 2013-09-08 DIAGNOSIS — E785 Hyperlipidemia, unspecified: Secondary | ICD-10-CM

## 2013-09-08 DIAGNOSIS — E669 Obesity, unspecified: Secondary | ICD-10-CM

## 2013-09-08 DIAGNOSIS — I10 Essential (primary) hypertension: Secondary | ICD-10-CM

## 2013-09-08 DIAGNOSIS — M25569 Pain in unspecified knee: Secondary | ICD-10-CM

## 2013-09-08 DIAGNOSIS — M25562 Pain in left knee: Secondary | ICD-10-CM

## 2013-09-08 MED ORDER — PHENTERMINE HCL 37.5 MG PO CAPS: 37.5000 mg | ORAL_CAPSULE | ORAL | Status: DC

## 2013-09-08 NOTE — Assessment & Plan Note (Signed)
Refilling phentermine. 

## 2013-09-08 NOTE — Progress Notes (Signed)
  Subjective:    CC: Followup  HPI: Hyperlipidemia: Still resistant to statin therapy. Risks, benefits, alternatives explained.  Diabetes mellitus type 2: Overall well-controlled on metformin.  Hypertension: Well controlled.  Knee osteoarthritis: Has a partial knee replacement coming up.  Obesity: Continued weight loss, almost 30 pounds on phentermine, he will need to stop phentermine for knee surgery, but will restart afterwards.  Past medical history, Surgical history, Family history not pertinant except as noted below, Social history, Allergies, and medications have been entered into the medical record, reviewed, and no changes needed.   Review of Systems: No fevers, chills, night sweats, weight loss, chest pain, or shortness of breath.   Objective:    General: Well Developed, well nourished, and in no acute distress.  Neuro: Alert and oriented x3, extra-ocular muscles intact, sensation grossly intact.  HEENT: Normocephalic, atraumatic, pupils equal round reactive to light, neck supple, no masses, no lymphadenopathy, thyroid nonpalpable.  Skin: Warm and dry, no rashes. Cardiac: Regular rate and rhythm, no murmurs rubs or gallops, no lower extremity edema.  Respiratory: Clear to auscultation bilaterally. Not using accessory muscles, speaking in full sentences.  Impression and Recommendations:

## 2013-09-08 NOTE — Assessment & Plan Note (Signed)
Scheduled partial knee replacement.

## 2013-09-08 NOTE — Assessment & Plan Note (Signed)
Still resistant to starting a statin medication. We will revisit this in 3 months.

## 2013-09-08 NOTE — Assessment & Plan Note (Signed)
Well-controlled, no complaints.

## 2013-09-12 ENCOUNTER — Encounter (HOSPITAL_BASED_OUTPATIENT_CLINIC_OR_DEPARTMENT_OTHER): Payer: Self-pay | Admitting: *Deleted

## 2013-09-12 NOTE — Progress Notes (Signed)
Will see if he [place] in [place] will do ekg-will bring all meds and overnight bag-denies sleep apnea-no cardiac problems

## 2013-09-12 NOTE — Progress Notes (Signed)
09/12/13 1626  OBSTRUCTIVE SLEEP APNEA  Have you ever been diagnosed with sleep apnea through a sleep study? No  Do you snore loudly (loud enough to be heard through closed doors)?  1  Do you often feel tired, fatigued, or sleepy during the daytime? 0  Has anyone observed you stop breathing during your sleep? 0  Do you have, or are you being treated for high blood pressure? 1  BMI more than 35 kg/m2? 0  Age over 55 years old? 1  Neck circumference greater than 40 cm/16 inches? 1  Gender: 1  Obstructive Sleep Apnea Score 5  Score 4 or greater  Results sent to PCP

## 2013-09-13 ENCOUNTER — Encounter: Payer: Self-pay | Admitting: Family Medicine

## 2013-09-13 ENCOUNTER — Ambulatory Visit (INDEPENDENT_AMBULATORY_CARE_PROVIDER_SITE_OTHER): Payer: Managed Care, Other (non HMO) | Admitting: Family Medicine

## 2013-09-13 ENCOUNTER — Other Ambulatory Visit: Payer: Self-pay | Admitting: Orthopedic Surgery

## 2013-09-13 VITALS — BP 127/83 | HR 82 | Wt 257.0 lb

## 2013-09-13 DIAGNOSIS — Z01810 Encounter for preprocedural cardiovascular examination: Secondary | ICD-10-CM

## 2013-09-13 DIAGNOSIS — Z136 Encounter for screening for cardiovascular disorders: Secondary | ICD-10-CM

## 2013-09-13 NOTE — Progress Notes (Signed)
CC: Johnathan Barr is a 53 y.o. male is here for EKG for upcomming surgery   Subjective: HPI:  Patient is requesting cardiovascular clearance for an upcoming left knee surgery occurring this Friday. He tells me he has no cardiac history, no known cardiovascular disease, has never had exertional chest pain, and can easily flex hemispheres without becoming short of breath or experiencing any chest pain or limb claudication. He states that he is in his normal state of health. He denies recent fevers, chills, cough, shortness of breath, wheezing, nor any history of pulmonary disease. He does have a history of hypertension however this is well controlled on lisinopril. Denies any recent motor sensory disturbances. Denies irregular heartbeat, peripheral edema, orthopnea nor PND.   Review Of Systems Outlined In HPI  Past Medical History  Diagnosis Date  . Hyperglycemia   . Colon polyps     2    Past Surgical History  Procedure Laterality Date  . Knee arthroscopy w/ medial collateral ligament (mcl) repair  2002    left  . Colonoscopy     Family History  Problem Relation Age of Onset  . Ovarian cancer Mother   . Heart disease Father   . Lung cancer Paternal Uncle   . Lymphoma Cousin     History   Social History  . Marital Status: Divorced    Spouse Name: N/A    Number of Children: N/A  . Years of Education: N/A   Occupational History  . Not on file.   Social History Main Topics  . Smoking status: Former Smoker -- 0.25 packs/day    Quit date: 09/12/2012  . Smokeless tobacco: Never Used  . Alcohol Use: Yes     Comment: almost daily  . Drug Use: No  . Sexual Activity: Yes    Birth Control/ Protection: None     Comment: vapo   Other Topics Concern  . Not on file   Social History Narrative   You have been given a separate informational sheet regarding your tobacco use, the importance of quitting and local resources to help you quit.           Objective: BP 127/83   Pulse 82  Wt 257 lb (116.574 kg)  General: Alert and Oriented, No Acute Distress HEENT: Pupils equal, round, reactive to light. Conjunctivae clear.  Moist mucous membranes pharynx unremarkable Lungs: Clear to auscultation bilaterally, no wheezing/ronchi/rales.  Comfortable work of breathing. Good air movement. Cardiac: Regular rate and rhythm. Normal S1/S2.  No murmurs, rubs, nor gallops.  No carotid bruits Extremities: No peripheral edema.  Strong peripheral pulses.  Mental Status: No depression, anxiety, nor agitation. Skin: Warm and dry.  Assessment & Plan: Johnathan Barr was seen today for ekg for upcomming surgery.  Diagnoses and associated orders for this visit:  Preoperative cardiovascular examination    EKG was obtained showing normal sinus rhythm with normal axis, normal intervals, a Q-wave in lead 3 but no other pathologic Q waves. No ST segment elevation or depression, T wave morphology is unremarkable.  I see no contraindication for his upcoming surgery provided his state of health does not change between now and then. It appears that his CBC and metabolic panel has already been ordered therefore I will not obtain any blood work today   Return if symptoms worsen or fail to improve.

## 2013-09-16 ENCOUNTER — Encounter (HOSPITAL_BASED_OUTPATIENT_CLINIC_OR_DEPARTMENT_OTHER)
Admission: RE | Disposition: A | Discharge status: Home / Self Care | Payer: Self-pay | Source: Ambulatory Visit | Attending: Orthopedic Surgery

## 2013-09-16 ENCOUNTER — Encounter (HOSPITAL_BASED_OUTPATIENT_CLINIC_OR_DEPARTMENT_OTHER): Payer: Managed Care, Other (non HMO) | Admitting: Certified Registered"

## 2013-09-16 ENCOUNTER — Ambulatory Visit (HOSPITAL_BASED_OUTPATIENT_CLINIC_OR_DEPARTMENT_OTHER): Payer: Managed Care, Other (non HMO) | Admitting: Certified Registered"

## 2013-09-16 ENCOUNTER — Ambulatory Visit (HOSPITAL_BASED_OUTPATIENT_CLINIC_OR_DEPARTMENT_OTHER)
Admission: RE | Admit: 2013-09-16 | Discharge: 2013-09-16 | Disposition: A | Discharge status: Home / Self Care | Payer: Managed Care, Other (non HMO) | Source: Ambulatory Visit | Attending: Orthopedic Surgery | Admitting: Orthopedic Surgery

## 2013-09-16 ENCOUNTER — Encounter (HOSPITAL_BASED_OUTPATIENT_CLINIC_OR_DEPARTMENT_OTHER): Payer: Self-pay | Admitting: *Deleted

## 2013-09-16 DIAGNOSIS — I10 Essential (primary) hypertension: Secondary | ICD-10-CM | POA: Insufficient documentation

## 2013-09-16 DIAGNOSIS — Z96652 Presence of left artificial knee joint: Secondary | ICD-10-CM | POA: Diagnosis present

## 2013-09-16 DIAGNOSIS — M1710 Unilateral primary osteoarthritis, unspecified knee: Secondary | ICD-10-CM | POA: Insufficient documentation

## 2013-09-16 DIAGNOSIS — M75101 Unspecified rotator cuff tear or rupture of right shoulder, not specified as traumatic: Secondary | ICD-10-CM

## 2013-09-16 DIAGNOSIS — M1712 Unilateral primary osteoarthritis, left knee: Secondary | ICD-10-CM

## 2013-09-16 DIAGNOSIS — Z87891 Personal history of nicotine dependence: Secondary | ICD-10-CM | POA: Insufficient documentation

## 2013-09-16 DIAGNOSIS — E119 Type 2 diabetes mellitus without complications: Secondary | ICD-10-CM | POA: Insufficient documentation

## 2013-09-16 DIAGNOSIS — K219 Gastro-esophageal reflux disease without esophagitis: Secondary | ICD-10-CM | POA: Insufficient documentation

## 2013-09-16 HISTORY — DX: Unilateral primary osteoarthritis, left knee: M17.12

## 2013-09-16 HISTORY — PX: PARTIAL KNEE ARTHROPLASTY: SHX2174

## 2013-09-16 LAB — GLUCOSE, CAPILLARY
Glucose-Capillary: 140 mg/dL — ABNORMAL HIGH (ref 70–99)
Glucose-Capillary: 157 mg/dL — ABNORMAL HIGH (ref 70–99)

## 2013-09-16 SURGERY — ARTHROPLASTY, KNEE, UNICOMPARTMENTAL
Anesthesia: Regional | Site: Knee | Laterality: Left

## 2013-09-16 MED ORDER — ONDANSETRON HCL 4 MG/2ML IJ SOLN
INTRAMUSCULAR | Status: DC | PRN
  Administered 2013-09-16: 4 mg via INTRAVENOUS

## 2013-09-16 MED ORDER — CEFAZOLIN SODIUM-DEXTROSE 2-3 GM-% IV SOLR
INTRAVENOUS | Status: AC
  Filled 2013-09-16: qty 50

## 2013-09-16 MED ORDER — FENTANYL CITRATE 0.05 MG/ML IJ SOLN
INTRAMUSCULAR | Status: AC
  Filled 2013-09-16: qty 10

## 2013-09-16 MED ORDER — KETOROLAC TROMETHAMINE 10 MG PO TABS: 10.0000 mg | ORAL_TABLET | Freq: Three times a day (TID) | ORAL | Status: DC

## 2013-09-16 MED ORDER — SENNA-DOCUSATE SODIUM 8.6-50 MG PO TABS: 2.0000 | ORAL_TABLET | Freq: Every day | ORAL | Status: DC

## 2013-09-16 MED ORDER — HYDROMORPHONE HCL PF 1 MG/ML IJ SOLN
INTRAMUSCULAR | Status: AC
  Filled 2013-09-16: qty 1

## 2013-09-16 MED ORDER — FENTANYL CITRATE 0.05 MG/ML IJ SOLN
INTRAMUSCULAR | Status: DC | PRN
  Administered 2013-09-16 (×4): 50 ug via INTRAVENOUS

## 2013-09-16 MED ORDER — EPHEDRINE SULFATE 50 MG/ML IJ SOLN
INTRAMUSCULAR | Status: DC | PRN
  Administered 2013-09-16: 5 mg via INTRAVENOUS

## 2013-09-16 MED ORDER — DEXAMETHASONE SODIUM PHOSPHATE 10 MG/ML IJ SOLN
INTRAMUSCULAR | Status: DC | PRN
  Administered 2013-09-16: 5 mg via INTRAVENOUS

## 2013-09-16 MED ORDER — OXYCODONE HCL 5 MG PO TABS
5.0000 mg | ORAL_TABLET | Freq: Once | ORAL | Status: AC | PRN
  Administered 2013-09-16: 5 mg via ORAL

## 2013-09-16 MED ORDER — PROMETHAZINE HCL 25 MG/ML IJ SOLN: 6.2500 mg | INTRAMUSCULAR | Status: DC | PRN

## 2013-09-16 MED ORDER — HYDROMORPHONE HCL PF 1 MG/ML IJ SOLN
0.2500 mg | INTRAMUSCULAR | Status: DC | PRN
  Administered 2013-09-16 (×4): 0.5 mg via INTRAVENOUS

## 2013-09-16 MED ORDER — ROPIVACAINE HCL 5 MG/ML IJ SOLN
INTRAMUSCULAR | Status: DC | PRN
  Administered 2013-09-16: 150 mg via PERINEURAL

## 2013-09-16 MED ORDER — OXYCODONE HCL 5 MG/5ML PO SOLN: 5.0000 mg | Freq: Once | ORAL | Status: AC | PRN

## 2013-09-16 MED ORDER — OXYCODONE-ACETAMINOPHEN 10-325 MG PO TABS: 1.0000 | ORAL_TABLET | Freq: Four times a day (QID) | ORAL | Status: DC | PRN

## 2013-09-16 MED ORDER — SODIUM CHLORIDE 0.9 % IR SOLN
Status: DC | PRN
  Administered 2013-09-16 (×2): 1000 mL

## 2013-09-16 MED ORDER — PROPOFOL 10 MG/ML IV BOLUS
INTRAVENOUS | Status: DC | PRN
  Administered 2013-09-16: 200 mg via INTRAVENOUS

## 2013-09-16 MED ORDER — CEFAZOLIN SODIUM-DEXTROSE 2-3 GM-% IV SOLR
2.0000 g | INTRAVENOUS | Status: AC
  Administered 2013-09-16: 2 g via INTRAVENOUS

## 2013-09-16 MED ORDER — OXYCODONE HCL 5 MG PO TABS
ORAL_TABLET | ORAL | Status: AC
  Filled 2013-09-16: qty 1

## 2013-09-16 MED ORDER — LACTATED RINGERS IV SOLN
INTRAVENOUS | Status: DC
Start: 2013-09-16 — End: 2013-09-16
  Administered 2013-09-16 (×3): via INTRAVENOUS

## 2013-09-16 MED ORDER — FENTANYL CITRATE 0.05 MG/ML IJ SOLN
50.0000 ug | INTRAMUSCULAR | Status: DC | PRN
  Administered 2013-09-16: 100 ug via INTRAVENOUS

## 2013-09-16 MED ORDER — MIDAZOLAM HCL 2 MG/2ML IJ SOLN
INTRAMUSCULAR | Status: AC
  Filled 2013-09-16: qty 2

## 2013-09-16 MED ORDER — RIVAROXABAN 10 MG PO TABS: 10.0000 mg | ORAL_TABLET | Freq: Every day | ORAL | Status: DC

## 2013-09-16 MED ORDER — PROPOFOL 10 MG/ML IV EMUL
INTRAVENOUS | Status: AC
  Filled 2013-09-16: qty 50

## 2013-09-16 MED ORDER — CYCLOBENZAPRINE HCL 10 MG PO TABS: ORAL_TABLET | ORAL | Status: DC

## 2013-09-16 MED ORDER — PROMETHAZINE HCL 25 MG PO TABS: 25.0000 mg | ORAL_TABLET | Freq: Four times a day (QID) | ORAL | Status: DC | PRN

## 2013-09-16 MED ORDER — MIDAZOLAM HCL 2 MG/2ML IJ SOLN
1.0000 mg | INTRAMUSCULAR | Status: DC | PRN
  Administered 2013-09-16: 2 mg via INTRAVENOUS

## 2013-09-16 MED ORDER — FENTANYL CITRATE 0.05 MG/ML IJ SOLN
INTRAMUSCULAR | Status: AC
  Filled 2013-09-16: qty 2

## 2013-09-16 SURGICAL SUPPLY — 73 items
BANDAGE ELASTIC 6 VELCRO ST LF (GAUZE/BANDAGES/DRESSINGS) ×6 IMPLANT
BANDAGE ESMARK 6X9 LF (GAUZE/BANDAGES/DRESSINGS) ×1 IMPLANT
BENZOIN TINCTURE PRP APPL 2/3 (GAUZE/BANDAGES/DRESSINGS) ×3 IMPLANT
BLADE SURG 10 STRL SS (BLADE) ×3 IMPLANT
BLADE SURG 15 STRL LF DISP TIS (BLADE) ×2 IMPLANT
BLADE SURG 15 STRL SS (BLADE) ×4
BNDG ESMARK 6X9 LF (GAUZE/BANDAGES/DRESSINGS) ×3
BOWL SMART MIX CTS (DISPOSABLE) ×3 IMPLANT
CANISTER SUCT 1200ML W/VALVE (MISCELLANEOUS) IMPLANT
CAPT KNEE OXFORD ×3 IMPLANT
CEMENT HV SMART SET (Cement) ×3 IMPLANT
COVER TABLE BACK 60X90 (DRAPES) ×6 IMPLANT
CUFF TOURNIQUET SINGLE 34IN LL (TOURNIQUET CUFF) IMPLANT
CUFF TOURNIQUET SINGLE 44IN (TOURNIQUET CUFF) ×3 IMPLANT
DECANTER SPIKE VIAL GLASS SM (MISCELLANEOUS) IMPLANT
DRAPE EXTREMITY T 121X128X90 (DRAPE) ×3 IMPLANT
DRAPE U 20/CS (DRAPES) ×3 IMPLANT
DRAPE U-SHAPE 47X51 STRL (DRAPES) ×3 IMPLANT
DRSG PAD ABDOMINAL 8X10 ST (GAUZE/BANDAGES/DRESSINGS) ×3 IMPLANT
DURAPREP 26ML APPLICATOR (WOUND CARE) ×3 IMPLANT
ELECT REM PT RETURN 9FT ADLT (ELECTROSURGICAL) ×3
ELECTRODE REM PT RTRN 9FT ADLT (ELECTROSURGICAL) ×1 IMPLANT
GAUZE SPONGE 4X4 12PLY STRL (GAUZE/BANDAGES/DRESSINGS) ×3 IMPLANT
GLOVE BIO SURGEON STRL SZ8 (GLOVE) ×3 IMPLANT
GLOVE BIOGEL PI IND STRL 7.0 (GLOVE) ×3 IMPLANT
GLOVE BIOGEL PI IND STRL 8 (GLOVE) ×4 IMPLANT
GLOVE BIOGEL PI INDICATOR 7.0 (GLOVE) ×6
GLOVE BIOGEL PI INDICATOR 8 (GLOVE) ×8
GLOVE ECLIPSE 6.5 STRL STRAW (GLOVE) ×9 IMPLANT
GLOVE ORTHO TXT STRL SZ7.5 (GLOVE) ×9 IMPLANT
GOWN STRL REUS W/ TWL LRG LVL3 (GOWN DISPOSABLE) ×1 IMPLANT
GOWN STRL REUS W/ TWL XL LVL3 (GOWN DISPOSABLE) ×3 IMPLANT
GOWN STRL REUS W/TWL LRG LVL3 (GOWN DISPOSABLE) ×2
GOWN STRL REUS W/TWL XL LVL3 (GOWN DISPOSABLE) ×6
HANDPIECE INTERPULSE COAX TIP (DISPOSABLE) ×2
IMMOBILIZER KNEE 22 UNIV (SOFTGOODS) IMPLANT
IMMOBILIZER KNEE 24 THIGH 36 (MISCELLANEOUS) ×1 IMPLANT
IMMOBILIZER KNEE 24 UNIV (MISCELLANEOUS) ×3
IV NS IRRIG 3000ML ARTHROMATIC (IV SOLUTION) ×3 IMPLANT
KNEE WRAP E Z 3 GEL PACK (MISCELLANEOUS) ×3 IMPLANT
MANIFOLD NEPTUNE II (INSTRUMENTS) ×3 IMPLANT
NS IRRIG 1000ML POUR BTL (IV SOLUTION) ×3 IMPLANT
PACK ARTHROSCOPY DSU (CUSTOM PROCEDURE TRAY) ×3 IMPLANT
PACK BASIN DAY SURGERY FS (CUSTOM PROCEDURE TRAY) ×3 IMPLANT
PEN SKIN MARKING BROAD TIP (MISCELLANEOUS) ×3 IMPLANT
PENCIL BUTTON HOLSTER BLD 10FT (ELECTRODE) ×3 IMPLANT
SAWBLADE OXFORD PARTIAL (BLADE) ×3 IMPLANT
SET HNDPC FAN SPRY TIP SCT (DISPOSABLE) ×1 IMPLANT
SHEET MEDIUM DRAPE 40X70 STRL (DRAPES) ×3 IMPLANT
SLEEVE SCD COMPRESS KNEE MED (MISCELLANEOUS) ×3 IMPLANT
SPONGE LAP 18X18 X RAY DECT (DISPOSABLE) ×3 IMPLANT
STAPLER VISISTAT 35W (STAPLE) IMPLANT
SUCTION FRAZIER TIP 10 FR DISP (SUCTIONS) ×3 IMPLANT
SUT ETHILON 3 0 PS 1 (SUTURE) IMPLANT
SUT ETHILON 4 0 PS 2 18 (SUTURE) IMPLANT
SUT MNCRL AB 4-0 PS2 18 (SUTURE) IMPLANT
SUT VIC AB 0 CT1 27 (SUTURE) ×2
SUT VIC AB 0 CT1 27XBRD ANBCTR (SUTURE) ×1 IMPLANT
SUT VIC AB 2-0 SH 18 (SUTURE) IMPLANT
SUT VIC AB 2-0 SH 27 (SUTURE) ×2
SUT VIC AB 2-0 SH 27XBRD (SUTURE) ×1 IMPLANT
SUT VIC AB 3-0 SH 27 (SUTURE) ×2
SUT VIC AB 3-0 SH 27X BRD (SUTURE) ×1 IMPLANT
SUT VICRYL 3-0 CR8 SH (SUTURE) IMPLANT
SUT VICRYL 4-0 PS2 18IN ABS (SUTURE) IMPLANT
SYR BULB IRRIGATION 50ML (SYRINGE) ×3 IMPLANT
TOWEL OR 17X24 6PK STRL BLUE (TOWEL DISPOSABLE) ×6 IMPLANT
TOWEL OR NON WOVEN STRL DISP B (DISPOSABLE) ×6 IMPLANT
TUBE CONNECTING 20'X1/4 (TUBING) ×1
TUBE CONNECTING 20X1/4 (TUBING) ×2 IMPLANT
UNDERPAD 30X30 INCONTINENT (UNDERPADS AND DIAPERS) IMPLANT
WRAP AROUND LENS ×6 IMPLANT
YANKAUER SUCT BULB TIP NO VENT (SUCTIONS) ×3 IMPLANT

## 2013-09-16 NOTE — Progress Notes (Signed)
Assisted Dr. Singer with left, ultrasound guided, femoral block. Side rails up, monitors on throughout procedure. See vital signs in flow sheet. Tolerated Procedure well.  

## 2013-09-16 NOTE — H&P (Signed)
PREOPERATIVE H&P  Chief Complaint: Left Knee: Degenerative Arthritis - Leg/Knee  HPI: Johnathan Barr is a 53 y.o. male who presents for preoperative history and physical with a diagnosis of Left Knee: Degenerative Arthritis - Leg/Knee. Symptoms are rated as moderate to severe, and have been worsening.  This is significantly impairing activities of daily living.  He has elected for surgical management. He has end-stage medial compartment degenerative changes on x-rays, failed injections activity modification and anti-inflammatories, as well as previous knee surgery.  Past Medical History  Diagnosis Date  . Hyperglycemia   . Colon polyps     2   Past Surgical History  Procedure Laterality Date  . Knee arthroscopy w/ medial collateral ligament (mcl) repair  2002    left  . Colonoscopy     History   Social History  . Marital Status: Divorced    Spouse Name: N/A    Number of Children: N/A  . Years of Education: N/A   Social History Main Topics  . Smoking status: Former Smoker -- 0.25 packs/day    Quit date: 09/12/2012  . Smokeless tobacco: Never Used  . Alcohol Use: Yes     Comment: almost daily  . Drug Use: No  . Sexual Activity: Yes    Birth Control/ Protection: None     Comment: vapo   Other Topics Concern  . None   Social History Narrative   You have been given a separate informational sheet regarding your tobacco use, the importance of quitting and local resources to help you quit.         Family History  Problem Relation Age of Onset  . Ovarian cancer Mother   . Heart disease Father   . Lung cancer Paternal Uncle   . Lymphoma Cousin    No Known Allergies Prior to Admission medications   Medication Sig Start Date End Date Taking? Authorizing Provider  buPROPion (WELLBUTRIN XL) 150 MG 24 hr tablet TAKE 1 TABLET (150 MG TOTAL) BY MOUTH DAILY. 08/08/13  Yes Monica Becton, MD  cyclobenzaprine (FLEXERIL) 10 MG tablet One half tab PO qHS, then increase  gradually to one tab TID. 03/22/13  Yes Monica Becton, MD  GLUMETZA 1000 MG 24 hr tablet TAKE 1 TABLET (1,000 MG TOTAL) BY MOUTH DAILY WITH BREAKFAST. 08/08/13  Yes Monica Becton, MD  lisinopril (PRINIVIL,ZESTRIL) 20 MG tablet Take 1 tablet (20 mg total) by mouth daily. 07/11/13  Yes Monica Becton, MD  meloxicam (MOBIC) 15 MG tablet One tab PO qAM with breakfast for 2 weeks, then daily prn pain. 08/30/12  Yes Monica Becton, MD  oxiconazole (OXISTAT) 1 % lotion Apply to affected area daily 08/30/12  Yes Monica Becton, MD  pantoprazole (PROTONIX) 20 MG tablet Take 2 tablets (40 mg total) by mouth daily. 08/30/12  Yes Monica Becton, MD  phentermine 37.5 MG capsule Take 1 capsule (37.5 mg total) by mouth every morning. 09/08/13   Monica Becton, MD     Positive ROS: All other systems have been reviewed and were otherwise negative with the exception of those mentioned in the HPI and as above.  Physical Exam: General: Alert, no acute distress Cardiovascular: No pedal edema Respiratory: No cyanosis, no use of accessory musculature GI: No organomegaly, abdomen is soft and non-tender Skin: No lesions in the area of chief complaint Neurologic: Sensation intact distally Psychiatric: Patient is competent for consent with normal mood and affect Lymphatic: No axillary or cervical lymphadenopathy  MUSCULOSKELETAL: The left knee has positive medial joint line tenderness, sooner laxity to valgus testing, positive crepitance.  Assessment: Left Knee: Degenerative Arthritis - Leg/Knee  Plan: Plan for Procedure(s): LEFT KNEE: ARTHROPLASTY KNEE CONDYLE AND PLATEAU MEDIAL COMPARTMENT  The risks benefits and alternatives were discussed with the patient including but not limited to the risks of nonoperative treatment, versus surgical intervention including infection, bleeding, nerve injury,  blood clots, cardiopulmonary complications, morbidity, mortality, among  others, and they were willing to proceed.   Eulas PostLANDAU,Lylah Lantis P, MD Cell [phone]   09/16/2013 7:22 AM

## 2013-09-16 NOTE — Transfer of Care (Signed)
Immediate Anesthesia Transfer of Care Note  Patient: Johnathan BaltimoreJohn E Barr  Procedure(s) Performed: Procedure(s): LEFT KNEE: ARTHROPLASTY KNEE CONDYLE AND PLATEAU MEDIAL COMPARTMENT (Left)  Patient Location: PACU  Anesthesia Type:GA combined with regional for post-op pain  Level of Consciousness: awake, alert , oriented and patient cooperative  Airway & Oxygen Therapy: Patient Spontanous Breathing and Patient connected to face mask oxygen  Post-op Assessment: Report given to PACU RN, Post -op Vital signs reviewed and stable and Patient moving all extremities  Post vital signs: Reviewed and stable  Complications: No apparent anesthesia complications

## 2013-09-16 NOTE — Discharge Instructions (Signed)
Diet: As you were doing prior to hospitalization  ° °Shower:  May shower but keep the wounds dry, use an occlusive plastic wrap, NO SOAKING IN TUB.  If the bandage gets wet, change with a clean dry gauze. ° °Dressing:  You may change your dressing 3-5 days after surgery.  Then change the dressing daily with sterile gauze dressing.   ° °There are sticky tapes (steri-strips) on your wounds and all the stitches are absorbable.  Leave the steri-strips in place when changing your dressings, they will peel off with time, usually 2-3 weeks. ° °Activity:  Increase activity slowly as tolerated, but follow the weight bearing instructions below.  No lifting or driving for 6 weeks. ° °Weight Bearing:   As tolerated.   ° °To prevent constipation: you may use a stool softener such as - ° °Colace (over the counter) 100 mg by mouth twice a day  °Drink plenty of fluids (prune juice may be helpful) and high fiber foods °Miralax (over the counter) for constipation as needed.   ° °Itching:  If you experience itching with your medications, try taking only a single pain pill, or even half a pain pill at a time.  You may take up to 10 pain pills per day, and you can also use benadryl over the counter for itching or also to help with sleep.  ° °Precautions:  If you experience chest pain or shortness of breath - call 911 immediately for transfer to the hospital emergency department!! ° °If you develop a fever greater that 101 F, purulent drainage from wound, increased redness or drainage from wound, or calf pain -- Call the office at [phone]                                                °Follow- Up Appointment:  Please call for an appointment to be seen in 2 weeks [place] - [phone] ° ° ° ° °Regional Anesthesia Blocks ° °1. Numbness or the inability to move the "blocked" extremity may last from 3-48 hours after placement. The length of time depends on the medication injected and your individual response to the medication. If  the numbness is not going away after 48 hours, call your surgeon. ° °2. The extremity that is blocked will need to be protected until the numbness is gone and the  Strength has returned. Because you cannot feel it, you will need to take extra care to avoid injury. Because it may be weak, you may have difficulty moving it or using it. You may not know what position it is in without looking at it while the block is in effect. ° °3. For blocks in the legs and feet, returning to weight bearing and walking needs to be done carefully. You will need to wait until the numbness is entirely gone and the strength has returned. You should be able to move your leg and foot normally before you try and bear weight or walk. You will need someone to be with you when you first try to ensure you do not fall and possibly risk injury. ° °4. Bruising and tenderness at the needle site are common side effects and will resolve in a few days. ° °5. Persistent numbness or new problems with movement should be communicated to the surgeon or the [facility] ([phone])/ [facility] (  [phone]). ° ° ° °Post Anesthesia Home Care Instructions ° °Activity: °Get plenty of rest for the remainder of the day. A responsible adult should stay with you for 24 hours following the procedure.  °For the next 24 hours, DO NOT: °-Drive a car °-Operate machinery °-Drink alcoholic beverages °-Take any medication unless instructed by your physician °-Make any legal decisions or sign important papers. ° °Meals: °Start with liquid foods such as gelatin or soup. Progress to regular foods as tolerated. Avoid greasy, spicy, heavy foods. If nausea and/or vomiting occur, drink only clear liquids until the nausea and/or vomiting subsides. Call your physician if vomiting continues. ° °Special Instructions/Symptoms: °Your throat may feel dry or sore from the anesthesia or the breathing tube placed in your throat during surgery. If this causes  discomfort, gargle with warm salt water. The discomfort should disappear within 24 hours. ° °

## 2013-09-16 NOTE — Op Note (Signed)
09/16/2013  9:53 AM  PATIENT:  Johnathan Barr    PRE-OPERATIVE DIAGNOSIS:  Left anteromedial knee osteoarthritis  POST-OPERATIVE DIAGNOSIS:  Same  PROCEDURE:  left partial knee replacement, medial compartment and  SURGEON:  [name], MD  PHYSICIAN ASSISTANT: Janace Litten, OPA-C, present and scrubbed throughout the case, critical for completion in a timely fashion, and for retraction, instrumentation, and closure.  Second assistant: Elza Rafter, PA student  ANESTHESIA:   General  PREOPERATIVE INDICATIONS:  [name] is a  53 y.o. male with a diagnosis of Left Knee: Degenerative Arthritis - Leg/Knee who failed conservative measures and elected for surgical management.  He has had a previous arthroscopic meniscectomy.  The risks benefits and alternatives were discussed with the patient preoperatively including but not limited to the risks of infection, bleeding, nerve injury, cardiopulmonary complications, blood clots, the need for revision surgery, among others, and the patient was willing to proceed.  OPERATIVE IMPLANTS: [employer] mobile bearing medial compartment arthroplasty femur size medium, tibia size D, bearing size 4.  OPERATIVE FINDINGS: Endstage grade 4 medial compartment osteoarthritis in a central area, the rest of the condyle had fairly diffuse grade 3 thinning. The tibial plateau also had a dance changes. The lateral side had minimal changes, one area of grade 2 changes, the patellofemoral joint had one area of grade 2 changes superiorly as well, but overall the rest of the joint was well maintained. The ACL was intact.  OPERATIVE PROCEDURE: The patient was brought to the operating room placed in supine position. General anesthesia was administered. IV antibiotics were given. The lower extremity was placed in the legholder and prepped and draped in usual sterile fashion.  Time out was performed.  The leg was elevated and exsanguinated and the tourniquet  was inflated. Anteromedial incision was performed, and I took care to preserve the MCL. Parapatellar incision was carried out, and the osteophytes were excised, along with the medial meniscus and a small portion of the fat pad.  The extra medullary tibial cutting jig was applied, using the spoon and the 4mm G-Clamp and the 2 mm shim, and I took care to protect the anterior cruciate ligament insertion and the tibial spine. The medial collateral ligament was also protected, and I resected my proximal tibia, matching the anatomic slope. This was a very thin cut. I was able to get the 4 mm paddle in place, but it was tight.  The proximal tibial bony cut was removed in one piece, and I turned my attention to the femur.  The intramedullary femoral rod was placed using the drill, the placement of the rod was fairly difficult, as bone quality was quite good, and I had to tap gently with the mallet.   I then used the appropriate reference, I assembled the femoral jig, setting my posterior cutting block. I resected my posterior femur, used the 0 spigot for the anterior femur, and then measured my gap. I could not fit the 4 mm for the 3 mm feeler gauge into the flexion gap at this point, despite the fact I was capable of doing this at the initial testing. Therefore I went back and reassembled the tibial jig, and resected an additional 2 mm. I was then able to actually test the gaps, and initially I used a 4 mm spigot, which was still not enough, and ended up going to 5 mm.   The gaps were then measured again with the appropriate feeler gauges. Once I had balanced flexion and  extension gaps, I then completed the preparation of the femur.  I milled off the anterior aspect of the distal femur to prevent impingement. I also exposed the tibia, and selected the above-named component, and then used the cutting jig to prepare the keel slot on the tibia. I also used the awl to curette out the bone to complete the preparation  of the keel. The back wall was intact. The trial size was possibly between a D and F., but I felt that the be provided appropriate medial lateral coverage without overhang.  I then placed trial components, and it was found to have excellent motion, and appropriate balance.  I then cemented the components into place, cementing the tibia first, removing all excess cement, and then cementing the femur.  All loose cement was removed.  The real polyethylene insert was applied manually, and the knee was taken through functional range of motion, and found to have excellent stability and restoration of joint motion, with excellent balance. It was fairly challenging to get the polyethylene in, however I was able to snap into place. The knee overall was fairly tight, throughout the entire operation.  The wounds were irrigated copiously, and the parapatellar tissue closed with Vicryl, followed by Vicryl for the subcutaneous tissue, with routine closure with Steri-Strips and sterile gauze.  The tourniquet was released, and the patient was awakened and extubated and returned to PACU in stable and satisfactory condition. There were no complications.

## 2013-09-16 NOTE — Anesthesia Preprocedure Evaluation (Signed)
Anesthesia Evaluation  Patient identified by MRN, date of birth, ID band Patient awake    Reviewed: Allergy & Precautions, H&P , NPO status , Patient's Chart, lab work & pertinent test results  Airway Mallampati: II TM Distance: >3 FB Neck ROM: full    Dental  (+) Teeth Intact, Dental Advidsory Given   Pulmonary former smoker,  breath sounds clear to auscultation        Cardiovascular hypertension, Rhythm:regular Rate:Normal     Neuro/Psych negative neurological ROS  negative psych ROS   GI/Hepatic negative GI ROS, Neg liver ROS, GERD-  Medicated,  Endo/Other  diabetes  Renal/GU negative Renal ROS     Musculoskeletal   Abdominal   Peds  Hematology   Anesthesia Other Findings   Reproductive/Obstetrics negative OB ROS                           Anesthesia Physical Anesthesia Plan  ASA: II  Anesthesia Plan: General LMA   Post-op Pain Management: MAC Combined w/ Regional for Post-op pain   Induction:   Airway Management Planned:   Additional Equipment:   Intra-op Plan:   Post-operative Plan:   Informed Consent: I have reviewed the patients History and Physical, chart, labs and discussed the procedure including the risks, benefits and alternatives for the proposed anesthesia with the patient or authorized representative who has indicated his/her understanding and acceptance.   Dental Advisory Given  Plan Discussed with: Anesthesiologist, CRNA and Surgeon  Anesthesia Plan Comments:         Anesthesia Quick Evaluation

## 2013-09-16 NOTE — Anesthesia Procedure Notes (Addendum)
Anesthesia Regional Block:  Femoral nerve block  Pre-Anesthetic Checklist: ,, timeout performed, Correct Patient, Correct Site, Correct Laterality, Correct Procedure, Correct Position, site marked, Risks and benefits discussed,  Surgical consent,  Pre-op evaluation,  At surgeon's request and post-op pain management  Laterality: Left  Prep: chloraprep       Needles:  Injection technique: Single-shot  Needle Type: Echogenic Stimulator Needle     Needle Length: 5cm 5 cm Needle Gauge: 22 and 22 G    Additional Needles:  Procedures: ultrasound guided (picture in chart) and nerve stimulator Femoral nerve block  Nerve Stimulator or Paresthesia:  Response: quadraceps contraction, 0.45 mA,   Additional Responses:   Narrative:  Start time: 09/16/2013 7:04 AM End time: 09/16/2013 7:14 AM Injection made incrementally with aspirations every 5 mL.  Performed by: Personally  Anesthesiologist: Halford DecampJ. Daniel Singer, MD  Additional Notes: Functioning IV was confirmed and monitors were applied.  A 50mm 22ga Arrow echogenic stimulator needle was used. Sterile prep and drape,hand hygiene and sterile gloves were used. Ultrasound guidance: relevant anatomy identified, needle position confirmed, local anesthetic spread visualized around nerve(s)., vascular puncture avoided.  Image printed for medical record. Negative aspiration and negative test dose prior to incremental administration of local anesthetic. The patient tolerated the procedure well.     Procedure Name: LMA Insertion Date/Time: 09/16/2013 7:44 AM Performed by: Curly ShoresRAFT, Alethia Melendrez W Pre-anesthesia Checklist: Patient identified, Emergency Drugs available, Suction available and Patient being monitored Patient Re-evaluated:Patient Re-evaluated prior to inductionOxygen Delivery Method: Circle System Utilized Preoxygenation: Pre-oxygenation with 100% oxygen Intubation Type: IV induction Ventilation: Mask ventilation without difficulty LMA: LMA  inserted LMA Size: 5.0 Number of attempts: 1 Airway Equipment and Method: bite block Placement Confirmation: positive ETCO2 and breath sounds checked- equal and bilateral Tube secured with: Tape Dental Injury: Teeth and Oropharynx as per pre-operative assessment

## 2013-09-19 ENCOUNTER — Encounter (HOSPITAL_BASED_OUTPATIENT_CLINIC_OR_DEPARTMENT_OTHER): Payer: Self-pay | Admitting: Orthopedic Surgery

## 2013-09-20 ENCOUNTER — Other Ambulatory Visit: Payer: Self-pay | Admitting: Sports Medicine

## 2013-09-28 NOTE — Anesthesia Postprocedure Evaluation (Signed)
Anesthesia Post Note  Patient: Johnathan BaltimoreJohn E Barr  Procedure(s) Performed: Procedure(s) (LRB): LEFT KNEE: ARTHROPLASTY KNEE CONDYLE AND PLATEAU MEDIAL COMPARTMENT (Left)  Anesthesia type: general  Patient location: PACU  Post pain: Pain level controlled  Post assessment: Patient's Cardiovascular Status Stable  Last Vitals:  Filed Vitals:   09/16/13 1330  BP: 127/81  Pulse: 77  Temp: 36.8 C  Resp: 16    Post vital signs: Reviewed and stable  Level of consciousness: sedated  Complications: No apparent anesthesia complications

## 2013-10-10 NOTE — Addendum Note (Signed)
Addended by: Chalmers CaterUTTLE, Jordynne Mccown H on: 10/10/2013 02:58 PM   Modules accepted: Orders

## 2013-11-02 ENCOUNTER — Ambulatory Visit (INDEPENDENT_AMBULATORY_CARE_PROVIDER_SITE_OTHER): Payer: Managed Care, Other (non HMO) | Admitting: Physical Therapy

## 2013-11-02 DIAGNOSIS — R269 Unspecified abnormalities of gait and mobility: Secondary | ICD-10-CM

## 2013-11-02 DIAGNOSIS — M6281 Muscle weakness (generalized): Secondary | ICD-10-CM

## 2013-11-02 DIAGNOSIS — M25569 Pain in unspecified knee: Secondary | ICD-10-CM

## 2013-11-02 DIAGNOSIS — M25669 Stiffness of unspecified knee, not elsewhere classified: Secondary | ICD-10-CM

## 2013-11-02 DIAGNOSIS — R609 Edema, unspecified: Secondary | ICD-10-CM

## 2013-11-04 ENCOUNTER — Ambulatory Visit (INDEPENDENT_AMBULATORY_CARE_PROVIDER_SITE_OTHER): Payer: Managed Care, Other (non HMO) | Admitting: Sports Medicine

## 2013-11-04 ENCOUNTER — Encounter: Payer: Self-pay | Admitting: Sports Medicine

## 2013-11-04 VITALS — BP 148/87 | HR 78 | Ht 72.0 in | Wt 231.0 lb

## 2013-11-04 DIAGNOSIS — E119 Type 2 diabetes mellitus without complications: Secondary | ICD-10-CM

## 2013-11-04 DIAGNOSIS — I493 Ventricular premature depolarization: Secondary | ICD-10-CM | POA: Insufficient documentation

## 2013-11-04 DIAGNOSIS — E669 Obesity, unspecified: Secondary | ICD-10-CM

## 2013-11-04 DIAGNOSIS — I498 Other specified cardiac arrhythmias: Secondary | ICD-10-CM

## 2013-11-04 DIAGNOSIS — I499 Cardiac arrhythmia, unspecified: Secondary | ICD-10-CM

## 2013-11-04 LAB — POCT HEMOGLOBIN: Hemoglobin: 11.9 g/dL — AB (ref 14.1–18.1)

## 2013-11-04 MED ORDER — FERROUS SULFATE 325 (65 FE) MG PO TBEC: 325.0000 mg | DELAYED_RELEASE_TABLET | Freq: Three times a day (TID) | ORAL | Status: DC

## 2013-11-04 MED ORDER — METOPROLOL SUCCINATE ER 50 MG PO TB24: 50.0000 mg | ORAL_TABLET | Freq: Every day | ORAL | Status: DC

## 2013-11-04 NOTE — Assessment & Plan Note (Signed)
Unclear at this point, awaiting EKG. Is very tired and post left partial knee replacement. Johnathan Barr is having occasional PVCs, he is also anemic with a hemoglobin of 11.9 after his knee replacement. We're going to check electrolytes, TSH, and I'm going to add low-dose beta blocker. He has been on phentermine in the past and never had PVCs, I have asked him to hold his phentermine for now. I'll also like him to see cardiology.

## 2013-11-04 NOTE — Assessment & Plan Note (Signed)
Okay to continue phentermine. Good weight loss so far. Return in a month.

## 2013-11-04 NOTE — Progress Notes (Signed)
  Subjective:    CC: Multiple issues  HPI: Obesity: Good weight loss, desires refill on phentermine.  Left knee osteoarthritis : is now 6 weeks post partial knee replacement. Still having significant pain, he does followup with Dr. Dion Saucier.  Weakness: Persistent after his surgery, no chest pain before or after his procedure, no leg swelling with the exception of the left leg postop, no palpitations, no shortness of breath. Just simple weakness.  Past medical history, Surgical history, Family history not pertinant except as noted below, Social history, Allergies, and medications have been entered into the medical record, reviewed, and no changes needed.   Review of Systems: No fevers, chills, night sweats, weight loss, chest pain, or shortness of breath.   Objective:    General: Well Developed, well nourished, and in no acute distress.  Neuro: Alert and oriented x3, extra-ocular muscles intact, sensation grossly intact.  HEENT: Normocephalic, atraumatic, pupils equal round reactive to light, neck supple, no masses, no lymphadenopathy, thyroid nonpalpable. Pale conjunctivae. Skin: Warm and dry, no rashes. Cardiac: Regular rate, regularly irregular rhythm, no murmurs rubs or gallops, no lower extremity edema.  Respiratory: Clear to auscultation bilaterally. Not using accessory muscles, speaking in full sentences. Left knee: Swollen, slightly warm, minimally tender to palpation at the medial tibial plateau, no induration, surgical scars are well healed.  Twelve-lead EKG shows a normal rate with occasional PVCs and compensatory pauses.  Hemoglobin was 11.9 on the point-of-care machine.  Impression and Recommendations:    I spent 40 minutes with this patient, greater than 50% was face-to-face time counseling regarding the above multiple diagnoses.

## 2013-11-05 LAB — COMPREHENSIVE METABOLIC PANEL WITH GFR
BUN: 19 mg/dL (ref 6–23)
Glucose, Bld: 87 mg/dL (ref 70–99)
Sodium: 140 meq/L (ref 135–145)
Total Bilirubin: 0.4 mg/dL (ref 0.2–1.2)
Total Protein: 7.2 g/dL (ref 6.0–8.3)

## 2013-11-05 LAB — COMPREHENSIVE METABOLIC PANEL
ALT: 14 U/L (ref 0–53)
AST: 15 U/L (ref 0–37)
Albumin: 4 g/dL (ref 3.5–5.2)
Alkaline Phosphatase: 112 U/L (ref 39–117)
CO2: 31 mEq/L (ref 19–32)
Calcium: 9.4 mg/dL (ref 8.4–10.5)
Chloride: 100 mEq/L (ref 96–112)
Creat: 0.92 mg/dL (ref 0.50–1.35)
Potassium: 4.8 mEq/L (ref 3.5–5.3)

## 2013-11-05 LAB — CBC
HCT: 38.1 % — ABNORMAL LOW (ref 39.0–52.0)
Hemoglobin: 12.6 g/dL — ABNORMAL LOW (ref 13.0–17.0)
MCH: 30.1 pg (ref 26.0–34.0)
MCHC: 33.1 g/dL (ref 30.0–36.0)
MCV: 90.9 fL (ref 78.0–100.0)
Platelets: 370 10*3/uL (ref 150–400)
RBC: 4.19 MIL/uL — ABNORMAL LOW (ref 4.22–5.81)
RDW: 13.6 % (ref 11.5–15.5)
WBC: 9.6 10*3/uL (ref 4.0–10.5)

## 2013-11-05 LAB — TSH: TSH: 0.864 u[IU]/mL (ref 0.350–4.500)

## 2013-11-08 ENCOUNTER — Encounter (INDEPENDENT_AMBULATORY_CARE_PROVIDER_SITE_OTHER): Payer: Managed Care, Other (non HMO) | Admitting: Physical Therapy

## 2013-11-08 DIAGNOSIS — R269 Unspecified abnormalities of gait and mobility: Secondary | ICD-10-CM

## 2013-11-08 DIAGNOSIS — M25669 Stiffness of unspecified knee, not elsewhere classified: Secondary | ICD-10-CM

## 2013-11-08 DIAGNOSIS — R609 Edema, unspecified: Secondary | ICD-10-CM

## 2013-11-08 DIAGNOSIS — M25569 Pain in unspecified knee: Secondary | ICD-10-CM

## 2013-11-08 DIAGNOSIS — M6281 Muscle weakness (generalized): Secondary | ICD-10-CM

## 2013-11-10 ENCOUNTER — Ambulatory Visit (INDEPENDENT_AMBULATORY_CARE_PROVIDER_SITE_OTHER): Payer: Managed Care, Other (non HMO) | Admitting: Sports Medicine

## 2013-11-10 ENCOUNTER — Encounter: Payer: Self-pay | Admitting: Sports Medicine

## 2013-11-10 VITALS — BP 120/69 | HR 77 | Ht 72.0 in | Wt 234.0 lb

## 2013-11-10 DIAGNOSIS — I4949 Other premature depolarization: Secondary | ICD-10-CM

## 2013-11-10 DIAGNOSIS — I493 Ventricular premature depolarization: Secondary | ICD-10-CM

## 2013-11-10 NOTE — Assessment & Plan Note (Signed)
There is some acute blood loss anemia from his knee replacement.  He is on iron 3 times a day. I have added a beta blocker to suppress however he is continuing to have PVCs. All vital signs are stable. No chest pain. Continue iron, he does have an appointment with cardiology coming up. I do suspect once his acute blood loss anemia is resolved, his PVCs will resolve as he has never had these before. I have asked him to remain off of phentermine for now. Return in one month.

## 2013-11-10 NOTE — Progress Notes (Signed)
  Subjective:    CC: Followup  HPI: Anemia: Likely postsurgical, start iron supplementation and feeling a bit better, he was having some PVCs on his EKG and does have an appointment with cardiology coming out. We did start a beta blocker he returns with completely stable vital signs.  Past medical history, Surgical history, Family history not pertinant except as noted below, Social history, Allergies, and medications have been entered into the medical record, reviewed, and no changes needed.   Review of Systems: No fevers, chills, night sweats, weight loss, chest pain, or shortness of breath.   Objective:    General: Well Developed, well nourished, and in no acute distress.  Neuro: Alert and oriented x3, extra-ocular muscles intact, sensation grossly intact.  HEENT: Normocephalic, atraumatic, pupils equal round reactive to light, neck supple, no masses, no lymphadenopathy, thyroid nonpalpable.  Skin: Warm and dry, no rashes. Cardiac: Regular rate , regularly irregular rhythm, no murmurs rubs or gallops, no lower extremity edema.  Respiratory: Clear to auscultation bilaterally. Not using accessory muscles, speaking in full sentences.  Impression and Recommendations:

## 2013-11-14 ENCOUNTER — Encounter: Payer: Managed Care, Other (non HMO) | Admitting: Physical Therapy

## 2013-11-16 ENCOUNTER — Encounter (INDEPENDENT_AMBULATORY_CARE_PROVIDER_SITE_OTHER): Payer: Managed Care, Other (non HMO) | Admitting: Physical Therapy

## 2013-11-16 DIAGNOSIS — Z471 Aftercare following joint replacement surgery: Secondary | ICD-10-CM

## 2013-11-16 DIAGNOSIS — M6281 Muscle weakness (generalized): Secondary | ICD-10-CM

## 2013-11-16 DIAGNOSIS — R609 Edema, unspecified: Secondary | ICD-10-CM

## 2013-11-16 DIAGNOSIS — Z96659 Presence of unspecified artificial knee joint: Secondary | ICD-10-CM

## 2013-11-16 DIAGNOSIS — R269 Unspecified abnormalities of gait and mobility: Secondary | ICD-10-CM

## 2013-11-16 DIAGNOSIS — M1710 Unilateral primary osteoarthritis, unspecified knee: Secondary | ICD-10-CM

## 2013-11-16 DIAGNOSIS — M25669 Stiffness of unspecified knee, not elsewhere classified: Secondary | ICD-10-CM

## 2013-11-18 ENCOUNTER — Encounter (INDEPENDENT_AMBULATORY_CARE_PROVIDER_SITE_OTHER): Payer: Managed Care, Other (non HMO)

## 2013-11-18 DIAGNOSIS — M25669 Stiffness of unspecified knee, not elsewhere classified: Secondary | ICD-10-CM

## 2013-11-18 DIAGNOSIS — M1710 Unilateral primary osteoarthritis, unspecified knee: Secondary | ICD-10-CM

## 2013-11-18 DIAGNOSIS — Z96659 Presence of unspecified artificial knee joint: Secondary | ICD-10-CM

## 2013-11-18 DIAGNOSIS — M6281 Muscle weakness (generalized): Secondary | ICD-10-CM

## 2013-11-18 DIAGNOSIS — R609 Edema, unspecified: Secondary | ICD-10-CM

## 2013-11-18 DIAGNOSIS — Z471 Aftercare following joint replacement surgery: Secondary | ICD-10-CM

## 2013-11-21 ENCOUNTER — Encounter (INDEPENDENT_AMBULATORY_CARE_PROVIDER_SITE_OTHER): Payer: Managed Care, Other (non HMO) | Admitting: Physical Therapy

## 2013-11-21 DIAGNOSIS — Z471 Aftercare following joint replacement surgery: Secondary | ICD-10-CM

## 2013-11-21 DIAGNOSIS — M1710 Unilateral primary osteoarthritis, unspecified knee: Secondary | ICD-10-CM

## 2013-11-21 DIAGNOSIS — Z96659 Presence of unspecified artificial knee joint: Secondary | ICD-10-CM

## 2013-11-23 ENCOUNTER — Encounter (INDEPENDENT_AMBULATORY_CARE_PROVIDER_SITE_OTHER): Payer: Managed Care, Other (non HMO) | Admitting: Physical Therapy

## 2013-11-23 DIAGNOSIS — Z96659 Presence of unspecified artificial knee joint: Secondary | ICD-10-CM

## 2013-11-23 DIAGNOSIS — R269 Unspecified abnormalities of gait and mobility: Secondary | ICD-10-CM

## 2013-11-23 DIAGNOSIS — M6281 Muscle weakness (generalized): Secondary | ICD-10-CM

## 2013-11-23 DIAGNOSIS — M25669 Stiffness of unspecified knee, not elsewhere classified: Secondary | ICD-10-CM

## 2013-11-23 DIAGNOSIS — R609 Edema, unspecified: Secondary | ICD-10-CM

## 2013-11-23 DIAGNOSIS — M25569 Pain in unspecified knee: Secondary | ICD-10-CM

## 2013-11-23 DIAGNOSIS — M1710 Unilateral primary osteoarthritis, unspecified knee: Secondary | ICD-10-CM

## 2013-11-23 DIAGNOSIS — Z471 Aftercare following joint replacement surgery: Secondary | ICD-10-CM

## 2013-11-25 ENCOUNTER — Encounter (INDEPENDENT_AMBULATORY_CARE_PROVIDER_SITE_OTHER): Payer: Managed Care, Other (non HMO) | Admitting: Physical Therapy

## 2013-11-25 DIAGNOSIS — R2689 Other abnormalities of gait and mobility: Secondary | ICD-10-CM

## 2013-11-25 DIAGNOSIS — M1710 Unilateral primary osteoarthritis, unspecified knee: Secondary | ICD-10-CM

## 2013-11-25 DIAGNOSIS — M25569 Pain in unspecified knee: Secondary | ICD-10-CM

## 2013-11-25 DIAGNOSIS — R600 Localized edema: Secondary | ICD-10-CM

## 2013-11-25 DIAGNOSIS — M6281 Muscle weakness (generalized): Secondary | ICD-10-CM

## 2013-11-26 ENCOUNTER — Other Ambulatory Visit: Payer: Self-pay | Admitting: Sports Medicine

## 2013-11-28 ENCOUNTER — Encounter (INDEPENDENT_AMBULATORY_CARE_PROVIDER_SITE_OTHER): Payer: Managed Care, Other (non HMO) | Admitting: Physical Therapy

## 2013-11-28 DIAGNOSIS — M1710 Unilateral primary osteoarthritis, unspecified knee: Secondary | ICD-10-CM

## 2013-11-28 DIAGNOSIS — M6281 Muscle weakness (generalized): Secondary | ICD-10-CM

## 2013-11-28 DIAGNOSIS — Z471 Aftercare following joint replacement surgery: Secondary | ICD-10-CM

## 2013-11-28 DIAGNOSIS — M25669 Stiffness of unspecified knee, not elsewhere classified: Secondary | ICD-10-CM

## 2013-11-28 DIAGNOSIS — M25569 Pain in unspecified knee: Secondary | ICD-10-CM

## 2013-11-28 DIAGNOSIS — R2689 Other abnormalities of gait and mobility: Secondary | ICD-10-CM

## 2013-11-28 DIAGNOSIS — R600 Localized edema: Secondary | ICD-10-CM

## 2013-11-30 ENCOUNTER — Encounter (INDEPENDENT_AMBULATORY_CARE_PROVIDER_SITE_OTHER): Payer: Managed Care, Other (non HMO)

## 2013-11-30 DIAGNOSIS — R600 Localized edema: Secondary | ICD-10-CM

## 2013-11-30 DIAGNOSIS — M25569 Pain in unspecified knee: Secondary | ICD-10-CM

## 2013-11-30 DIAGNOSIS — M1710 Unilateral primary osteoarthritis, unspecified knee: Secondary | ICD-10-CM

## 2013-11-30 DIAGNOSIS — Z96651 Presence of right artificial knee joint: Secondary | ICD-10-CM

## 2013-11-30 DIAGNOSIS — M6281 Muscle weakness (generalized): Secondary | ICD-10-CM

## 2013-11-30 DIAGNOSIS — Z471 Aftercare following joint replacement surgery: Secondary | ICD-10-CM

## 2013-11-30 DIAGNOSIS — R2689 Other abnormalities of gait and mobility: Secondary | ICD-10-CM

## 2013-11-30 DIAGNOSIS — M25669 Stiffness of unspecified knee, not elsewhere classified: Secondary | ICD-10-CM

## 2013-12-02 ENCOUNTER — Encounter (INDEPENDENT_AMBULATORY_CARE_PROVIDER_SITE_OTHER): Payer: Managed Care, Other (non HMO)

## 2013-12-02 DIAGNOSIS — M25569 Pain in unspecified knee: Secondary | ICD-10-CM

## 2013-12-02 DIAGNOSIS — M1710 Unilateral primary osteoarthritis, unspecified knee: Secondary | ICD-10-CM

## 2013-12-02 DIAGNOSIS — Z471 Aftercare following joint replacement surgery: Secondary | ICD-10-CM

## 2013-12-02 DIAGNOSIS — M6281 Muscle weakness (generalized): Secondary | ICD-10-CM

## 2013-12-02 DIAGNOSIS — M25669 Stiffness of unspecified knee, not elsewhere classified: Secondary | ICD-10-CM

## 2013-12-05 ENCOUNTER — Encounter (INDEPENDENT_AMBULATORY_CARE_PROVIDER_SITE_OTHER): Payer: Managed Care, Other (non HMO) | Admitting: Physical Therapy

## 2013-12-05 DIAGNOSIS — M25669 Stiffness of unspecified knee, not elsewhere classified: Secondary | ICD-10-CM

## 2013-12-05 DIAGNOSIS — M25569 Pain in unspecified knee: Secondary | ICD-10-CM

## 2013-12-05 DIAGNOSIS — Z471 Aftercare following joint replacement surgery: Secondary | ICD-10-CM

## 2013-12-05 DIAGNOSIS — Z96651 Presence of right artificial knee joint: Secondary | ICD-10-CM

## 2013-12-05 DIAGNOSIS — M1710 Unilateral primary osteoarthritis, unspecified knee: Secondary | ICD-10-CM

## 2013-12-05 DIAGNOSIS — M6281 Muscle weakness (generalized): Secondary | ICD-10-CM

## 2013-12-05 DIAGNOSIS — M2689 Other dentofacial anomalies: Secondary | ICD-10-CM

## 2013-12-05 DIAGNOSIS — R600 Localized edema: Secondary | ICD-10-CM

## 2013-12-07 ENCOUNTER — Encounter: Payer: Managed Care, Other (non HMO) | Admitting: Physical Therapy

## 2013-12-07 ENCOUNTER — Encounter (INDEPENDENT_AMBULATORY_CARE_PROVIDER_SITE_OTHER): Payer: Managed Care, Other (non HMO) | Admitting: Physical Therapy

## 2013-12-07 DIAGNOSIS — Z471 Aftercare following joint replacement surgery: Secondary | ICD-10-CM

## 2013-12-07 DIAGNOSIS — R2689 Other abnormalities of gait and mobility: Secondary | ICD-10-CM

## 2013-12-07 DIAGNOSIS — R600 Localized edema: Secondary | ICD-10-CM

## 2013-12-07 DIAGNOSIS — M25569 Pain in unspecified knee: Secondary | ICD-10-CM

## 2013-12-07 DIAGNOSIS — M25669 Stiffness of unspecified knee, not elsewhere classified: Secondary | ICD-10-CM

## 2013-12-07 DIAGNOSIS — M6281 Muscle weakness (generalized): Secondary | ICD-10-CM

## 2013-12-07 DIAGNOSIS — Z96651 Presence of right artificial knee joint: Secondary | ICD-10-CM

## 2013-12-07 DIAGNOSIS — M1710 Unilateral primary osteoarthritis, unspecified knee: Secondary | ICD-10-CM

## 2013-12-09 ENCOUNTER — Ambulatory Visit (INDEPENDENT_AMBULATORY_CARE_PROVIDER_SITE_OTHER): Payer: Managed Care, Other (non HMO) | Admitting: Sports Medicine

## 2013-12-09 ENCOUNTER — Encounter: Payer: Self-pay | Admitting: Sports Medicine

## 2013-12-09 ENCOUNTER — Encounter (INDEPENDENT_AMBULATORY_CARE_PROVIDER_SITE_OTHER): Payer: Managed Care, Other (non HMO) | Admitting: Physical Therapy

## 2013-12-09 VITALS — BP 111/70 | HR 63 | Ht 72.0 in | Wt 239.0 lb

## 2013-12-09 DIAGNOSIS — G4700 Insomnia, unspecified: Secondary | ICD-10-CM | POA: Diagnosis not present

## 2013-12-09 DIAGNOSIS — E119 Type 2 diabetes mellitus without complications: Secondary | ICD-10-CM | POA: Diagnosis not present

## 2013-12-09 DIAGNOSIS — M6281 Muscle weakness (generalized): Secondary | ICD-10-CM

## 2013-12-09 DIAGNOSIS — M25569 Pain in unspecified knee: Secondary | ICD-10-CM

## 2013-12-09 DIAGNOSIS — Z471 Aftercare following joint replacement surgery: Secondary | ICD-10-CM

## 2013-12-09 DIAGNOSIS — M1710 Unilateral primary osteoarthritis, unspecified knee: Secondary | ICD-10-CM

## 2013-12-09 DIAGNOSIS — Z96651 Presence of right artificial knee joint: Secondary | ICD-10-CM

## 2013-12-09 DIAGNOSIS — I493 Ventricular premature depolarization: Secondary | ICD-10-CM | POA: Diagnosis not present

## 2013-12-09 DIAGNOSIS — R2689 Other abnormalities of gait and mobility: Secondary | ICD-10-CM

## 2013-12-09 DIAGNOSIS — R600 Localized edema: Secondary | ICD-10-CM

## 2013-12-09 MED ORDER — TRAZODONE HCL 150 MG PO TABS: 150.0000 mg | ORAL_TABLET | Freq: Every day | ORAL | Status: DC

## 2013-12-09 NOTE — Assessment & Plan Note (Signed)
Starting trazodone 150 at bedtime.

## 2013-12-09 NOTE — Progress Notes (Signed)
  Subjective:    CC: Followup  HPI: Palpitations: Resolved with improvement of anemia postoperative, and adding metoprolol.  Diabetes mellitus type 2: Due for a recheck.  Hypertension: Well controlled.  Post knee arthroplasty: Managed by Dr. Dion SaucierLandau, it sounds as though he will be undergoing manipulation under anesthesia to improve his range of motion..  Past medical history, Surgical history, Family history not pertinant except as noted below, Social history, Allergies, and medications have been entered into the medical record, reviewed, and no changes needed.   Review of Systems: No fevers, chills, night sweats, weight loss, chest pain, or shortness of breath.   Objective:    General: Well Developed, well nourished, and in no acute distress.  Neuro: Alert and oriented x3, extra-ocular muscles intact, sensation grossly intact.  HEENT: Normocephalic, atraumatic, pupils equal round reactive to light, neck supple, no masses, no lymphadenopathy, thyroid nonpalpable.  Skin: Warm and dry, no rashes. Cardiac: Regular rate and rhythm, no murmurs rubs or gallops, no lower extremity edema.  Respiratory: Clear to auscultation bilaterally. Not using accessory muscles, speaking in full sentences.  Impression and Recommendations:

## 2013-12-09 NOTE — Assessment & Plan Note (Signed)
Resolved. There was an element of anemia, this is improving. Rechecking CBC. Continue iron supplementation.

## 2013-12-09 NOTE — Assessment & Plan Note (Signed)
Recheck hemoglobin A1c 

## 2013-12-13 ENCOUNTER — Encounter (INDEPENDENT_AMBULATORY_CARE_PROVIDER_SITE_OTHER): Payer: Managed Care, Other (non HMO) | Admitting: Physical Therapy

## 2013-12-13 DIAGNOSIS — R2689 Other abnormalities of gait and mobility: Secondary | ICD-10-CM

## 2013-12-13 DIAGNOSIS — M25669 Stiffness of unspecified knee, not elsewhere classified: Secondary | ICD-10-CM

## 2013-12-13 DIAGNOSIS — R600 Localized edema: Secondary | ICD-10-CM

## 2013-12-13 DIAGNOSIS — Z471 Aftercare following joint replacement surgery: Secondary | ICD-10-CM

## 2013-12-13 DIAGNOSIS — Z96651 Presence of right artificial knee joint: Secondary | ICD-10-CM

## 2013-12-13 DIAGNOSIS — M1710 Unilateral primary osteoarthritis, unspecified knee: Secondary | ICD-10-CM

## 2013-12-13 DIAGNOSIS — M6281 Muscle weakness (generalized): Secondary | ICD-10-CM

## 2013-12-13 DIAGNOSIS — M25569 Pain in unspecified knee: Secondary | ICD-10-CM

## 2013-12-15 ENCOUNTER — Encounter: Payer: Managed Care, Other (non HMO) | Admitting: Physical Therapy

## 2013-12-23 ENCOUNTER — Encounter: Payer: Managed Care, Other (non HMO) | Admitting: Physical Therapy

## 2013-12-24 LAB — HEMOGLOBIN A1C
Hgb A1c MFr Bld: 7 % — ABNORMAL HIGH (ref <5.7)
Mean Plasma Glucose: 154 mg/dL — ABNORMAL HIGH (ref <117)

## 2013-12-24 LAB — CBC
HCT: 38.9 % — ABNORMAL LOW (ref 39.0–52.0)
Hemoglobin: 13.2 g/dL (ref 13.0–17.0)
MCH: 30 pg (ref 26.0–34.0)
MCHC: 33.9 g/dL (ref 30.0–36.0)
MCV: 88.4 fL (ref 78.0–100.0)
Platelets: 308 10*3/uL (ref 150–400)
RBC: 4.4 MIL/uL (ref 4.22–5.81)
RDW: 15.2 % (ref 11.5–15.5)
WBC: 7 10*3/uL (ref 4.0–10.5)

## 2013-12-28 ENCOUNTER — Encounter: Payer: Self-pay | Admitting: *Deleted

## 2013-12-28 ENCOUNTER — Encounter: Payer: Self-pay | Admitting: Cardiology

## 2013-12-28 ENCOUNTER — Ambulatory Visit (INDEPENDENT_AMBULATORY_CARE_PROVIDER_SITE_OTHER): Payer: Managed Care, Other (non HMO) | Admitting: Cardiology

## 2013-12-28 VITALS — BP 124/84 | HR 60 | Ht 72.0 in | Wt 239.1 lb

## 2013-12-28 DIAGNOSIS — I10 Essential (primary) hypertension: Secondary | ICD-10-CM

## 2013-12-28 DIAGNOSIS — E785 Hyperlipidemia, unspecified: Secondary | ICD-10-CM

## 2013-12-28 DIAGNOSIS — I493 Ventricular premature depolarization: Secondary | ICD-10-CM

## 2013-12-28 NOTE — Assessment & Plan Note (Signed)
Blood pressure controlled. Continue present medications. 

## 2013-12-28 NOTE — Assessment & Plan Note (Signed)
Management per primary care. 

## 2013-12-28 NOTE — Assessment & Plan Note (Signed)
Patient is noted to have frequent PVCs on electrocardiogram. He is asymptomatic with no palpitations, dyspnea, chest pain or history of syncope. Continue Toprol. Will arrange echocardiogram. If LV function normal will not pursue further cardiac workup. Note recent TSH and potassium normal.

## 2013-12-28 NOTE — Progress Notes (Signed)
HPI: 53 year old male for evaluation of palpitations. Patient had knee surgery in July 2015. He has been noted to have PVCs since. Laboratory September 2015 showed normal TSH and potassium. Patient recently had knee surgery. Preoperatively he apparently had an electrocardiogram that revealed PVCs. He has been noted to have PVCs postoperatively as well. He denies dyspnea, chest pain, palpitations. He had a syncopal episode at age 53 after standing quickly and no other episodes. He states he had a stress echocardiogram in 2004 that was unremarkable.  Current Outpatient Prescriptions  Medication Sig Dispense Refill  . buPROPion (WELLBUTRIN XL) 150 MG 24 hr tablet TAKE 1 TABLET (150 MG TOTAL) BY MOUTH DAILY. 90 tablet 3  . cyclobenzaprine (FLEXERIL) 10 MG tablet One half tab PO qHS, then increase gradually to one tab TID. 75 tablet 0  . ferrous sulfate 325 (65 FE) MG EC tablet Take 1 tablet (325 mg total) by mouth 3 (three) times daily with meals. 90 tablet 11  . GLUMETZA 1000 MG 24 hr tablet TAKE 1 TABLET BY MOUTH DAILY WITH BREAKFAST. 90 tablet 0  . lisinopril-hydrochlorothiazide (PRINZIDE,ZESTORETIC) 10-12.5 MG per tablet TAKE 1 TABLET BY MOUTH DAILY. 90 tablet 0  . metoprolol succinate (TOPROL XL) 50 MG 24 hr tablet Take 1 tablet (50 mg total) by mouth daily. Take with or immediately following a meal. 30 tablet 3  . oxiconazole (OXISTAT) 1 % lotion Apply to affected area daily 30 mL 1  . oxyCODONE-acetaminophen (PERCOCET) 10-325 MG per tablet Take 1-2 tablets by mouth every 6 (six) hours as needed for pain. MAXIMUM TOTAL ACETAMINOPHEN DOSE IS 4000 MG PER DAY 75 tablet 0  . pantoprazole (PROTONIX) 20 MG tablet Take 2 tablets (40 mg total) by mouth daily. 180 tablet 3   No current facility-administered medications for this visit.    No Known Allergies   Past Medical History  Diagnosis Date  . Colon polyps     2  . Osteoarthritis of left knee 09/16/2013  . Diabetes mellitus   .  Hypertension   . Hyperlipidemia   . Hyperthyroidism     Past Surgical History  Procedure Laterality Date  . Knee arthroscopy w/ medial collateral ligament (mcl) repair  2002    left  . Colonoscopy    . Partial knee arthroplasty Left 09/16/2013    Procedure: LEFT KNEE: ARTHROPLASTY KNEE CONDYLE AND PLATEAU MEDIAL COMPARTMENT;  Surgeon: Eulas PostJoshua P Landau, MD;  Location: [facility];  Service: Orthopedics;  Laterality: Left;    History   Social History  . Marital Status: Divorced    Spouse Name: N/A    Number of Children: 3  . Years of Education: N/A   Occupational History  .      Retail bankerAircraft mechanic   Social History Main Topics  . Smoking status: Former Smoker -- 0.25 packs/day    Quit date: 09/12/2012  . Smokeless tobacco: Never Used  . Alcohol Use: 0.0 oz/week    0 Not specified per week     Comment: almost daily  . Drug Use: No  . Sexual Activity: Yes    Birth Control/ Protection: None     Comment: vapo   Other Topics Concern  . Not on file   Social History Narrative   You have been given a separate informational sheet regarding your tobacco use, the importance of quitting and local resources to help you quit.          Family History  Problem Relation Age  of Onset  . Ovarian cancer Mother   . Heart disease Father     CAD  . Lung cancer Paternal Uncle   . Lymphoma Cousin     ROS: Residual knee pain from recent surgery but no fevers or chills, productive cough, hemoptysis, dysphasia, odynophagia, melena, hematochezia, dysuria, hematuria, rash, seizure activity, orthopnea, PND, pedal edema, claudication. Remaining systems are negative.  Physical Exam:   Blood pressure 124/84, pulse 60, height 6' (1.829 m), weight 239 lb 1.9 oz (108.464 kg).  General:  Well developed/well nourished in NAD Skin warm/dry Patient not depressed No peripheral clubbing Back-normal HEENT-normal/normal eyelids Neck supple/normal carotid upstroke bilaterally; no  bruits; no JVD; no thyromegaly chest - CTA/ normal expansion CV - RRR/normal S1 and S2; no murmurs, rubs or gallops;  PMI nondisplaced Abdomen -NT/ND, no HSM, no mass, + bowel sounds, no bruit 2+ femoral pulses, no bruits Ext-no edema, chords, 2+ DP; Status post recent knee surgery Neuro-grossly nonfocal  ECG 11/04/2013-sinus rhythm, PVCs.

## 2013-12-28 NOTE — Patient Instructions (Signed)
Your physician recommends that you schedule a follow-up appointment in: AS NEEDED PENDING TEST RESULTS  Your physician has requested that you have an echocardiogram. Echocardiography is a painless test that uses sound waves to create images of your heart. It provides your doctor with information about the size and shape of your heart and how well your heart's chambers and valves are working. This procedure takes approximately one hour. There are no restrictions for this procedure.    

## 2013-12-30 ENCOUNTER — Ambulatory Visit: Payer: Managed Care, Other (non HMO) | Admitting: Physical Therapy

## 2014-01-02 ENCOUNTER — Encounter (INDEPENDENT_AMBULATORY_CARE_PROVIDER_SITE_OTHER): Payer: Managed Care, Other (non HMO) | Admitting: Physical Therapy

## 2014-01-02 DIAGNOSIS — M1712 Unilateral primary osteoarthritis, left knee: Secondary | ICD-10-CM

## 2014-01-02 DIAGNOSIS — M24662 Ankylosis, left knee: Secondary | ICD-10-CM

## 2014-01-02 DIAGNOSIS — M6281 Muscle weakness (generalized): Secondary | ICD-10-CM

## 2014-01-02 DIAGNOSIS — M25569 Pain in unspecified knee: Secondary | ICD-10-CM

## 2014-01-02 DIAGNOSIS — M25669 Stiffness of unspecified knee, not elsewhere classified: Secondary | ICD-10-CM

## 2014-01-02 DIAGNOSIS — R600 Localized edema: Secondary | ICD-10-CM

## 2014-01-03 ENCOUNTER — Encounter (INDEPENDENT_AMBULATORY_CARE_PROVIDER_SITE_OTHER): Payer: Managed Care, Other (non HMO) | Admitting: Physical Therapy

## 2014-01-03 ENCOUNTER — Other Ambulatory Visit: Payer: Self-pay | Admitting: Sports Medicine

## 2014-01-03 DIAGNOSIS — R600 Localized edema: Secondary | ICD-10-CM

## 2014-01-03 DIAGNOSIS — M24662 Ankylosis, left knee: Secondary | ICD-10-CM

## 2014-01-03 DIAGNOSIS — M1712 Unilateral primary osteoarthritis, left knee: Secondary | ICD-10-CM

## 2014-01-03 DIAGNOSIS — M6281 Muscle weakness (generalized): Secondary | ICD-10-CM

## 2014-01-03 DIAGNOSIS — M25669 Stiffness of unspecified knee, not elsewhere classified: Secondary | ICD-10-CM

## 2014-01-03 DIAGNOSIS — M25569 Pain in unspecified knee: Secondary | ICD-10-CM

## 2014-01-05 ENCOUNTER — Ambulatory Visit (HOSPITAL_COMMUNITY)
Admission: RE | Admit: 2014-01-05 | Discharge: 2014-01-05 | Disposition: A | Discharge status: Home / Self Care | Payer: Managed Care, Other (non HMO) | Source: Ambulatory Visit | Attending: Cardiology | Admitting: Cardiology

## 2014-01-05 DIAGNOSIS — R002 Palpitations: Secondary | ICD-10-CM | POA: Diagnosis not present

## 2014-01-05 DIAGNOSIS — I493 Ventricular premature depolarization: Secondary | ICD-10-CM | POA: Diagnosis present

## 2014-01-05 DIAGNOSIS — I059 Rheumatic mitral valve disease, unspecified: Secondary | ICD-10-CM

## 2014-01-05 NOTE — Progress Notes (Signed)
2D Echocardiogram Complete.  01/05/2014   Purity Irmen, RDCS  

## 2014-01-06 ENCOUNTER — Telehealth: Payer: Self-pay | Admitting: Sports Medicine

## 2014-01-06 ENCOUNTER — Ambulatory Visit (INDEPENDENT_AMBULATORY_CARE_PROVIDER_SITE_OTHER): Payer: Managed Care, Other (non HMO)

## 2014-01-06 ENCOUNTER — Ambulatory Visit (INDEPENDENT_AMBULATORY_CARE_PROVIDER_SITE_OTHER): Payer: Managed Care, Other (non HMO) | Admitting: Sports Medicine

## 2014-01-06 ENCOUNTER — Encounter: Payer: Managed Care, Other (non HMO) | Admitting: Physical Therapy

## 2014-01-06 ENCOUNTER — Other Ambulatory Visit: Payer: Self-pay | Admitting: Orthopedic Surgery

## 2014-01-06 ENCOUNTER — Encounter: Payer: Self-pay | Admitting: Sports Medicine

## 2014-01-06 VITALS — BP 123/80 | HR 91 | Ht 72.0 in | Wt 233.0 lb

## 2014-01-06 DIAGNOSIS — M79605 Pain in left leg: Secondary | ICD-10-CM | POA: Diagnosis not present

## 2014-01-06 DIAGNOSIS — Z96659 Presence of unspecified artificial knee joint: Principal | ICD-10-CM

## 2014-01-06 DIAGNOSIS — Z96652 Presence of left artificial knee joint: Secondary | ICD-10-CM | POA: Diagnosis not present

## 2014-01-06 DIAGNOSIS — T8459XD Infection and inflammatory reaction due to other internal joint prosthesis, subsequent encounter: Secondary | ICD-10-CM

## 2014-01-06 DIAGNOSIS — I493 Ventricular premature depolarization: Secondary | ICD-10-CM

## 2014-01-06 DIAGNOSIS — M25462 Effusion, left knee: Secondary | ICD-10-CM

## 2014-01-06 DIAGNOSIS — Z1389 Encounter for screening for other disorder: Secondary | ICD-10-CM

## 2014-01-06 LAB — CBC
HCT: 38.2 % — ABNORMAL LOW (ref 39.0–52.0)
Hemoglobin: 13.1 g/dL (ref 13.0–17.0)
MCH: 30.3 pg (ref 26.0–34.0)
MCHC: 34.3 g/dL (ref 30.0–36.0)
MCV: 88.4 fL (ref 78.0–100.0)
Platelets: 333 10*3/uL (ref 150–400)
RBC: 4.32 MIL/uL (ref 4.22–5.81)
RDW: 14.6 % (ref 11.5–15.5)
WBC: 11.1 10*3/uL — ABNORMAL HIGH (ref 4.0–10.5)

## 2014-01-06 LAB — BASIC METABOLIC PANEL WITH GFR
CO2: 25 meq/L (ref 19–32)
Chloride: 96 meq/L (ref 96–112)
Creat: 1 mg/dL (ref 0.50–1.35)

## 2014-01-06 LAB — BASIC METABOLIC PANEL
BUN: 14 mg/dL (ref 6–23)
Calcium: 9.6 mg/dL (ref 8.4–10.5)
Glucose, Bld: 136 mg/dL — ABNORMAL HIGH (ref 70–99)
Potassium: 4.5 mEq/L (ref 3.5–5.3)
Sodium: 135 mEq/L (ref 135–145)

## 2014-01-06 LAB — SEDIMENTATION RATE: Sed Rate: 109 mm/hr — ABNORMAL HIGH (ref 0–16)

## 2014-01-06 MED ORDER — KETOROLAC TROMETHAMINE 30 MG/ML IJ SOLN
30.0000 mg | Freq: Once | INTRAMUSCULAR | Status: AC
  Administered 2014-01-06: 30 mg via INTRAMUSCULAR

## 2014-01-06 MED ORDER — KETOROLAC TROMETHAMINE 10 MG PO TABS: 10.0000 mg | ORAL_TABLET | Freq: Three times a day (TID) | ORAL | Status: DC | PRN

## 2014-01-06 NOTE — Assessment & Plan Note (Addendum)
Persistent pain for months after procedure, had a manipulation under anesthesia last week. Persistent constitutional symptoms, severe pain in the suprapatellar recess. Knee does feel significant only warm, ultrasound however does not show an effusion. From my understanding he has undergone a fairly extensive workup by his orthopedic surgeon for infection including attempted aspiration without fluid obtained, and injection after his manipulation under anesthesia. We are going to obtain an x-ray, ESR, and an MRI with IV contrast considering he continues to have extremely severe pain for months after his procedure.  MRI should be a MARS protocol, metal artifact reduction sequence. I am going to attempt to contact Dr. Mardelle Matte as well as the meantime. Toradol 30 mg intramuscular, he does need to continue to use his narcotics.  Discussed with Dr. Mardelle Matte, I would like to bring the patient back in 1 week after his MARS MRI, we can go over the results, evaluate the trend of his ESR, and I will attempt to tap the small amount of fluid in his knee with ultrasound guidance. Ultimately he may need reexploration with biopsies and cultures. Considering findings on x-ray it is possible that his pain is due to a fracture, periprosthetic during the manipulation, there is also some lucency on the x-ray that is suggestive of an osteomyelitis. The patient needs to come back, and I will attempt to drain fluid before the weekend.  Aspirated frank pus from the left knee, sending back to orthopedic surgery tonight, patient will need removal of his knee prosthesis with antibiotic impregnated spacer.

## 2014-01-06 NOTE — Telephone Encounter (Signed)
Called [employer] @ [phone] to get pre authorization for MRI Left Knee W/Contrast.  Spoke w/Kennette and NO Pre Authorization is required.  WB

## 2014-01-06 NOTE — Assessment & Plan Note (Signed)
Stable from a cardiac perspective. PVCs are well controlled with Toprol and he has a structurally normal heart on echo.

## 2014-01-06 NOTE — Progress Notes (Signed)
  Subjective:    CC: follow-up  HPI: Johnathan Barr is 4 months post partial knee replacement and one week post manipulation under anesthesia, he has had persistent pain, occasional swelling.  He did well initially after the manipulation, and through a course of physical therapy but then on Tuesday of this week developed severe pain that he localizes proximally over the distal quadriceps and the medial and lateral joint lines. He does tell me he has having some constitutional symptoms as well. He did have a mildly elevated ESR after his surgery, he has had a tap done which did not yield any fluid, he also had an injection after his manipulation. Pain is persistent and severe, he did not take his pain medication today.  Past medical history, Surgical history, Family history not pertinant except as noted below, Social history, Allergies, and medications have been entered into the medical record, reviewed, and no changes needed.   Review of Systems: No fevers, chills, night sweats, weight loss, chest pain, or shortness of breath.   Objective:    General: Well Developed, well nourished, and in no acute distress.  Neuro: Alert and oriented x3, extra-ocular muscles intact, sensation grossly intact.  HEENT: Normocephalic, atraumatic, pupils equal round reactive to light, neck supple, no masses, no lymphadenopathy, thyroid nonpalpable.  Skin: Warm and dry, no rashes. Cardiac: Regular rate and rhythm, no murmurs rubs or gallops, no lower extremity edema.  Respiratory: Clear to auscultation bilaterally. Not using accessory muscles, speaking in full sentences. Right knee: Only minimally swollen, not erythematous but warm, incision is well-healed. There is severe tenderness to palpation on the medial and lateral suprapatellar recess without a palpable fluid wave.  Range of motion is from a proximally 90 to 10.  Procedure: Real-time Ultrasound Guided aspiration/Injection of lidocaine of left knee Device: GE Logiq E   Verbal informed consent obtained.  Time-out conducted.  Noted no overlying erythema, induration, or other signs of local infection.  Skin prepped in a sterile fashion.  Local anesthesia: Topical Ethyl chloride.  With sterile technique and under real time ultrasound guidance:  Noted predominantly hemarthrosis, congealed, I did see a small of fluid and guided an 18-gauge needle into this area, I then aspirated approximately 10 mL of pus, syringe switched and injected a combination of lidocaine and Marcaine. Completed without difficulty  Pain immediately resolved suggesting accurate placement of the medication.  Advised to call if fevers/chills, erythema, induration, drainage, or persistent bleeding.  Images permanently stored and available for review in the ultrasound unit.  Impression: Technically successful ultrasound guided injection.  Impression and Recommendations:    I spent over 40 minutes with this patient, greater than 50% was face-to-face time counseling regarding the above diagnosis.

## 2014-01-07 ENCOUNTER — Other Ambulatory Visit: Payer: Self-pay

## 2014-01-07 ENCOUNTER — Ambulatory Visit (HOSPITAL_BASED_OUTPATIENT_CLINIC_OR_DEPARTMENT_OTHER)
Admission: RE | Admit: 2014-01-07 | Discharge: 2014-01-07 | Disposition: A | Discharge status: Home / Self Care | Payer: Managed Care, Other (non HMO) | Source: Ambulatory Visit | Attending: Sports Medicine | Admitting: Sports Medicine

## 2014-01-07 ENCOUNTER — Inpatient Hospital Stay (HOSPITAL_BASED_OUTPATIENT_CLINIC_OR_DEPARTMENT_OTHER)
Admission: AD | Admit: 2014-01-07 | Discharge: 2014-01-10 | DRG: 465 | Disposition: A | Discharge status: Home Health | Payer: Managed Care, Other (non HMO) | Source: Ambulatory Visit | Attending: Orthopedic Surgery | Admitting: Orthopedic Surgery

## 2014-01-07 DIAGNOSIS — Z801 Family history of malignant neoplasm of trachea, bronchus and lung: Secondary | ICD-10-CM | POA: Diagnosis not present

## 2014-01-07 DIAGNOSIS — Z8614 Personal history of Methicillin resistant Staphylococcus aureus infection: Secondary | ICD-10-CM

## 2014-01-07 DIAGNOSIS — Y831 Surgical operation with implant of artificial internal device as the cause of abnormal reaction of the patient, or of later complication, without mention of misadventure at the time of the procedure: Secondary | ICD-10-CM | POA: Diagnosis present

## 2014-01-07 DIAGNOSIS — T8454XA Infection and inflammatory reaction due to internal left knee prosthesis, initial encounter: Principal | ICD-10-CM | POA: Diagnosis present

## 2014-01-07 DIAGNOSIS — R936 Abnormal findings on diagnostic imaging of limbs: Secondary | ICD-10-CM

## 2014-01-07 DIAGNOSIS — K219 Gastro-esophageal reflux disease without esophagitis: Secondary | ICD-10-CM | POA: Diagnosis present

## 2014-01-07 DIAGNOSIS — I10 Essential (primary) hypertension: Secondary | ICD-10-CM | POA: Diagnosis present

## 2014-01-07 DIAGNOSIS — Z8249 Family history of ischemic heart disease and other diseases of the circulatory system: Secondary | ICD-10-CM

## 2014-01-07 DIAGNOSIS — Z87891 Personal history of nicotine dependence: Secondary | ICD-10-CM | POA: Diagnosis not present

## 2014-01-07 DIAGNOSIS — E119 Type 2 diabetes mellitus without complications: Secondary | ICD-10-CM | POA: Diagnosis present

## 2014-01-07 DIAGNOSIS — E0590 Thyrotoxicosis, unspecified without thyrotoxic crisis or storm: Secondary | ICD-10-CM | POA: Diagnosis present

## 2014-01-07 DIAGNOSIS — Z807 Family history of other malignant neoplasms of lymphoid, hematopoietic and related tissues: Secondary | ICD-10-CM

## 2014-01-07 DIAGNOSIS — E785 Hyperlipidemia, unspecified: Secondary | ICD-10-CM | POA: Diagnosis present

## 2014-01-07 DIAGNOSIS — Z96652 Presence of left artificial knee joint: Secondary | ICD-10-CM | POA: Diagnosis present

## 2014-01-07 DIAGNOSIS — R451 Restlessness and agitation: Secondary | ICD-10-CM | POA: Diagnosis not present

## 2014-01-07 DIAGNOSIS — M75101 Unspecified rotator cuff tear or rupture of right shoulder, not specified as traumatic: Secondary | ICD-10-CM

## 2014-01-07 DIAGNOSIS — Z96659 Presence of unspecified artificial knee joint: Secondary | ICD-10-CM

## 2014-01-07 DIAGNOSIS — T8459XA Infection and inflammatory reaction due to other internal joint prosthesis, initial encounter: Secondary | ICD-10-CM

## 2014-01-07 HISTORY — DX: Infection and inflammatory reaction due to other internal joint prosthesis, initial encounter: T84.59XA

## 2014-01-07 HISTORY — DX: Presence of unspecified artificial knee joint: Z96.659

## 2014-01-07 LAB — CBC
HCT: 38.3 % — ABNORMAL LOW (ref 39.0–52.0)
HEMOGLOBIN: 12.3 g/dL — AB (ref 13.0–17.0)
MCH: 29.8 pg (ref 26.0–34.0)
MCHC: 32.1 g/dL (ref 30.0–36.0)
MCV: 92.7 fL (ref 78.0–100.0)
Platelets: 295 10*3/uL (ref 150–400)
RBC: 4.13 MIL/uL — ABNORMAL LOW (ref 4.22–5.81)
RDW: 14.1 % (ref 11.5–15.5)
WBC: 7.6 10*3/uL (ref 4.0–10.5)

## 2014-01-07 LAB — BASIC METABOLIC PANEL
Anion gap: 14 (ref 5–15)
BUN: 15 mg/dL (ref 6–23)
CALCIUM: 9.5 mg/dL (ref 8.4–10.5)
CHLORIDE: 100 meq/L (ref 96–112)
CO2: 27 mEq/L (ref 19–32)
CREATININE: 0.87 mg/dL (ref 0.50–1.35)
Glucose, Bld: 119 mg/dL — ABNORMAL HIGH (ref 70–99)
Potassium: 4.3 mEq/L (ref 3.7–5.3)
Sodium: 141 mEq/L (ref 137–147)

## 2014-01-07 LAB — SYNOVIAL CELL COUNT + DIFF, W/ CRYSTALS
Crystals, Fluid: NONE SEEN
Eosinophils-Synovial: 0 % (ref 0–1)
Lymphocytes-Synovial Fld: 1 % (ref 0–20)
Monocyte/Macrophage: 3 % — ABNORMAL LOW (ref 50–90)
Neutrophil, Synovial: 96 % — ABNORMAL HIGH (ref 0–25)
WBC, Synovial: 121995 cu mm — ABNORMAL HIGH (ref 0–200)

## 2014-01-07 LAB — SEDIMENTATION RATE: Sed Rate: 100 mm/hr — ABNORMAL HIGH (ref 0–16)

## 2014-01-07 LAB — SURGICAL PCR SCREEN
MRSA, PCR: POSITIVE — AB
STAPHYLOCOCCUS AUREUS: POSITIVE — AB

## 2014-01-07 MED ORDER — FERROUS SULFATE 325 (65 FE) MG PO TABS
325.0000 mg | ORAL_TABLET | Freq: Two times a day (BID) | ORAL | Status: DC
  Administered 2014-01-07: 325 mg via ORAL
  Filled 2014-01-07 (×4): qty 1

## 2014-01-07 MED ORDER — MORPHINE SULFATE 2 MG/ML IJ SOLN: 2.0000 mg | INTRAMUSCULAR | Status: DC | PRN

## 2014-01-07 MED ORDER — PANTOPRAZOLE SODIUM 20 MG PO TBEC
20.0000 mg | DELAYED_RELEASE_TABLET | Freq: Every day | ORAL | Status: DC
  Administered 2014-01-07: 20 mg via ORAL
  Filled 2014-01-07 (×2): qty 1

## 2014-01-07 MED ORDER — MUPIROCIN 2 % EX OINT
1.0000 "application " | TOPICAL_OINTMENT | Freq: Two times a day (BID) | CUTANEOUS | Status: DC
  Administered 2014-01-08 – 2014-01-10 (×4): 1 via NASAL
  Filled 2014-01-07: qty 22

## 2014-01-07 MED ORDER — CHLORHEXIDINE GLUCONATE CLOTH 2 % EX PADS
6.0000 | MEDICATED_PAD | Freq: Every day | CUTANEOUS | Status: DC
  Administered 2014-01-08 – 2014-01-10 (×2): 6 via TOPICAL

## 2014-01-07 MED ORDER — BUPROPION HCL ER (XL) 150 MG PO TB24
150.0000 mg | ORAL_TABLET | Freq: Every day | ORAL | Status: DC
  Administered 2014-01-07: 150 mg via ORAL
  Filled 2014-01-07 (×2): qty 1

## 2014-01-07 MED ORDER — METOPROLOL SUCCINATE ER 50 MG PO TB24
50.0000 mg | ORAL_TABLET | Freq: Every day | ORAL | Status: DC
  Administered 2014-01-07: 50 mg via ORAL
  Filled 2014-01-07 (×2): qty 1

## 2014-01-07 MED ORDER — OXYCODONE-ACETAMINOPHEN 5-325 MG PO TABS
2.0000 | ORAL_TABLET | ORAL | Status: DC | PRN
  Administered 2014-01-07 – 2014-01-08 (×2): 2 via ORAL
  Filled 2014-01-07 (×2): qty 2

## 2014-01-07 MED ORDER — HYDROCHLOROTHIAZIDE 12.5 MG PO CAPS
12.5000 mg | ORAL_CAPSULE | Freq: Every day | ORAL | Status: DC
  Administered 2014-01-07: 12.5 mg via ORAL
  Filled 2014-01-07 (×2): qty 1

## 2014-01-07 MED ORDER — LISINOPRIL 10 MG PO TABS
10.0000 mg | ORAL_TABLET | Freq: Every day | ORAL | Status: DC
  Administered 2014-01-07: 10 mg via ORAL
  Filled 2014-01-07 (×2): qty 1

## 2014-01-07 NOTE — Plan of Care (Signed)
Problem: Consults Goal: Skin Care Protocol Initiated - if Braden Score 18 or less If consults are not indicated, leave blank or document N/A Outcome: Not Applicable Date Met:  59/53/96 Goal: Nutrition Consult-if indicated Outcome: Not Applicable Date Met:  72/89/79

## 2014-01-08 ENCOUNTER — Inpatient Hospital Stay (HOSPITAL_COMMUNITY): Payer: Managed Care, Other (non HMO)

## 2014-01-08 ENCOUNTER — Inpatient Hospital Stay (HOSPITAL_COMMUNITY)
Admission: RE | Admit: 2014-01-08 | Payer: Managed Care, Other (non HMO) | Source: Ambulatory Visit | Admitting: Orthopedic Surgery

## 2014-01-08 ENCOUNTER — Inpatient Hospital Stay (HOSPITAL_COMMUNITY): Payer: Managed Care, Other (non HMO) | Admitting: Anesthesiology

## 2014-01-08 ENCOUNTER — Encounter (HOSPITAL_COMMUNITY)
Admission: AD | Disposition: A | Discharge status: Home Health | Payer: Self-pay | Source: Ambulatory Visit | Attending: Orthopedic Surgery

## 2014-01-08 ENCOUNTER — Encounter (HOSPITAL_COMMUNITY): Payer: Self-pay | Admitting: Orthopedic Surgery

## 2014-01-08 DIAGNOSIS — T8459XA Infection and inflammatory reaction due to other internal joint prosthesis, initial encounter: Secondary | ICD-10-CM

## 2014-01-08 DIAGNOSIS — T8454XA Infection and inflammatory reaction due to internal left knee prosthesis, initial encounter: Principal | ICD-10-CM

## 2014-01-08 DIAGNOSIS — Z96659 Presence of unspecified artificial knee joint: Secondary | ICD-10-CM

## 2014-01-08 HISTORY — PX: I&D EXTREMITY: SHX5045

## 2014-01-08 HISTORY — DX: Presence of unspecified artificial knee joint: Z96.659

## 2014-01-08 HISTORY — DX: Presence of unspecified artificial knee joint: T84.59XA

## 2014-01-08 HISTORY — PX: TOTAL KNEE REVISION: SHX996

## 2014-01-08 LAB — GLUCOSE, CAPILLARY
GLUCOSE-CAPILLARY: 134 mg/dL — AB (ref 70–99)
GLUCOSE-CAPILLARY: 89 mg/dL (ref 70–99)
Glucose-Capillary: 159 mg/dL — ABNORMAL HIGH (ref 70–99)

## 2014-01-08 LAB — C-REACTIVE PROTEIN: CRP: 8.8 mg/dL — AB (ref <0.60)

## 2014-01-08 SURGERY — IRRIGATION AND DEBRIDEMENT EXTREMITY
Anesthesia: General | Laterality: Left

## 2014-01-08 MED ORDER — PANTOPRAZOLE SODIUM 20 MG PO TBEC
20.0000 mg | DELAYED_RELEASE_TABLET | Freq: Every day | ORAL | Status: DC
  Administered 2014-01-08 – 2014-01-10 (×3): 20 mg via ORAL
  Filled 2014-01-08 (×3): qty 1

## 2014-01-08 MED ORDER — LISINOPRIL-HYDROCHLOROTHIAZIDE 10-12.5 MG PO TABS
1.0000 | ORAL_TABLET | Freq: Every day | ORAL | Status: DC
Start: 2014-01-08 — End: 2014-01-08

## 2014-01-08 MED ORDER — DOCUSATE SODIUM 100 MG PO CAPS
100.0000 mg | ORAL_CAPSULE | Freq: Two times a day (BID) | ORAL | Status: DC
  Administered 2014-01-08 – 2014-01-10 (×4): 100 mg via ORAL
  Filled 2014-01-08 (×5): qty 1

## 2014-01-08 MED ORDER — ARTIFICIAL TEARS OP OINT
TOPICAL_OINTMENT | OPHTHALMIC | Status: DC | PRN
  Administered 2014-01-08: 1 via OPHTHALMIC

## 2014-01-08 MED ORDER — GLYCOPYRROLATE 0.2 MG/ML IJ SOLN
INTRAMUSCULAR | Status: AC
  Filled 2014-01-08: qty 1

## 2014-01-08 MED ORDER — FLEET ENEMA 7-19 GM/118ML RE ENEM: 1.0000 | ENEMA | Freq: Once | RECTAL | Status: AC | PRN

## 2014-01-08 MED ORDER — FENTANYL CITRATE 0.05 MG/ML IJ SOLN
INTRAMUSCULAR | Status: AC
  Filled 2014-01-08: qty 5

## 2014-01-08 MED ORDER — SODIUM CHLORIDE 0.9 % IR SOLN
Status: DC | PRN
  Administered 2014-01-08 (×3): 3000 mL

## 2014-01-08 MED ORDER — ACETAMINOPHEN 160 MG/5ML PO SOLN
325.0000 mg | ORAL | Status: DC | PRN
  Filled 2014-01-08: qty 20.3

## 2014-01-08 MED ORDER — HYDROCHLOROTHIAZIDE 12.5 MG PO CAPS
12.5000 mg | ORAL_CAPSULE | Freq: Every day | ORAL | Status: DC
  Administered 2014-01-08 – 2014-01-10 (×3): 12.5 mg via ORAL
  Filled 2014-01-08 (×3): qty 1

## 2014-01-08 MED ORDER — MIDAZOLAM HCL 2 MG/2ML IJ SOLN
INTRAMUSCULAR | Status: AC
  Filled 2014-01-08: qty 2

## 2014-01-08 MED ORDER — HYDROMORPHONE HCL 1 MG/ML IJ SOLN
0.2500 mg | INTRAMUSCULAR | Status: DC | PRN
  Administered 2014-01-08 (×6): 0.5 mg via INTRAVENOUS

## 2014-01-08 MED ORDER — VANCOMYCIN HCL IN DEXTROSE 1-5 GM/200ML-% IV SOLN
INTRAVENOUS | Status: AC
  Filled 2014-01-08: qty 200

## 2014-01-08 MED ORDER — LISINOPRIL 10 MG PO TABS
10.0000 mg | ORAL_TABLET | Freq: Every day | ORAL | Status: DC
  Administered 2014-01-08 – 2014-01-10 (×3): 10 mg via ORAL
  Filled 2014-01-08 (×3): qty 1

## 2014-01-08 MED ORDER — POTASSIUM CHLORIDE IN NACL 20-0.45 MEQ/L-% IV SOLN
INTRAVENOUS | Status: DC
  Administered 2014-01-08: 17:00:00 via INTRAVENOUS
  Administered 2014-01-09: 75 mL/h via INTRAVENOUS
  Filled 2014-01-08 (×6): qty 1000

## 2014-01-08 MED ORDER — POLYETHYLENE GLYCOL 3350 17 G PO PACK: 17.0000 g | PACK | Freq: Every day | ORAL | Status: DC | PRN

## 2014-01-08 MED ORDER — EPHEDRINE SULFATE 50 MG/ML IJ SOLN
INTRAMUSCULAR | Status: AC
  Filled 2014-01-08: qty 1

## 2014-01-08 MED ORDER — PROPOFOL 10 MG/ML IV BOLUS
INTRAVENOUS | Status: AC
  Filled 2014-01-08: qty 20

## 2014-01-08 MED ORDER — LACTATED RINGERS IV SOLN
INTRAVENOUS | Status: DC | PRN
  Administered 2014-01-08 (×2): via INTRAVENOUS

## 2014-01-08 MED ORDER — METOPROLOL SUCCINATE ER 50 MG PO TB24
50.0000 mg | ORAL_TABLET | Freq: Every day | ORAL | Status: DC
  Administered 2014-01-08 – 2014-01-10 (×3): 50 mg via ORAL
  Filled 2014-01-08 (×3): qty 1

## 2014-01-08 MED ORDER — PHENOL 1.4 % MT LIQD
1.0000 | OROMUCOSAL | Status: DC | PRN
Start: 2014-01-08 — End: 2014-01-10

## 2014-01-08 MED ORDER — VANCOMYCIN HCL IN DEXTROSE 1-5 GM/200ML-% IV SOLN
1000.0000 mg | INTRAVENOUS | Status: AC
  Administered 2014-01-08: 1000 mg via INTRAVENOUS
  Filled 2014-01-08: qty 200

## 2014-01-08 MED ORDER — BUPIVACAINE HCL (PF) 0.25 % IJ SOLN
INTRAMUSCULAR | Status: AC
  Filled 2014-01-08: qty 30

## 2014-01-08 MED ORDER — ARTIFICIAL TEARS OP OINT
TOPICAL_OINTMENT | OPHTHALMIC | Status: AC
  Filled 2014-01-08: qty 3.5

## 2014-01-08 MED ORDER — FENTANYL CITRATE 0.05 MG/ML IJ SOLN
INTRAMUSCULAR | Status: DC | PRN
  Administered 2014-01-08: 50 ug via INTRAVENOUS
  Administered 2014-01-08: 25 ug via INTRAVENOUS
  Administered 2014-01-08 (×8): 50 ug via INTRAVENOUS
  Administered 2014-01-08: 25 ug via INTRAVENOUS

## 2014-01-08 MED ORDER — OXYCODONE HCL 5 MG PO TABS
5.0000 mg | ORAL_TABLET | ORAL | Status: DC | PRN
  Administered 2014-01-08 – 2014-01-10 (×10): 10 mg via ORAL
  Filled 2014-01-08 (×9): qty 2

## 2014-01-08 MED ORDER — RIVAROXABAN 10 MG PO TABS
10.0000 mg | ORAL_TABLET | Freq: Every day | ORAL | Status: DC
  Administered 2014-01-09 – 2014-01-10 (×2): 10 mg via ORAL
  Filled 2014-01-08 (×3): qty 1

## 2014-01-08 MED ORDER — CEFTRIAXONE SODIUM IN DEXTROSE 40 MG/ML IV SOLN
2.0000 g | INTRAVENOUS | Status: DC
  Administered 2014-01-08 – 2014-01-09 (×2): 2 g via INTRAVENOUS
  Filled 2014-01-08 (×3): qty 50

## 2014-01-08 MED ORDER — CLOTRIMAZOLE 1 % EX CREA
TOPICAL_CREAM | Freq: Two times a day (BID) | CUTANEOUS | Status: DC
  Administered 2014-01-09 – 2014-01-10 (×2): via TOPICAL
  Filled 2014-01-08: qty 15

## 2014-01-08 MED ORDER — CYCLOBENZAPRINE HCL 10 MG PO TABS
10.0000 mg | ORAL_TABLET | Freq: Three times a day (TID) | ORAL | Status: DC | PRN
  Administered 2014-01-08: 10 mg via ORAL
  Filled 2014-01-08: qty 1

## 2014-01-08 MED ORDER — SENNA 8.6 MG PO TABS
1.0000 | ORAL_TABLET | Freq: Two times a day (BID) | ORAL | Status: DC
  Administered 2014-01-09 – 2014-01-10 (×4): 8.6 mg via ORAL
  Filled 2014-01-08 (×6): qty 1

## 2014-01-08 MED ORDER — BISACODYL 10 MG RE SUPP: 10.0000 mg | Freq: Every day | RECTAL | Status: DC | PRN

## 2014-01-08 MED ORDER — MENTHOL 3 MG MT LOZG: 1.0000 | LOZENGE | OROMUCOSAL | Status: DC | PRN

## 2014-01-08 MED ORDER — OXICONAZOLE NITRATE 1 % EX LOTN: TOPICAL_LOTION | Freq: Every day | CUTANEOUS | Status: DC

## 2014-01-08 MED ORDER — ONDANSETRON HCL 4 MG/2ML IJ SOLN
INTRAMUSCULAR | Status: AC
  Filled 2014-01-08: qty 2

## 2014-01-08 MED ORDER — VANCOMYCIN HCL IN DEXTROSE 1-5 GM/200ML-% IV SOLN
1000.0000 mg | Freq: Two times a day (BID) | INTRAVENOUS | Status: DC
  Administered 2014-01-08 – 2014-01-10 (×4): 1000 mg via INTRAVENOUS
  Filled 2014-01-08 (×5): qty 200

## 2014-01-08 MED ORDER — ACETAMINOPHEN 325 MG PO TABS: 650.0000 mg | ORAL_TABLET | Freq: Four times a day (QID) | ORAL | Status: DC | PRN

## 2014-01-08 MED ORDER — PROPOFOL 10 MG/ML IV BOLUS
INTRAVENOUS | Status: DC | PRN
  Administered 2014-01-08: 200 mg via INTRAVENOUS
  Administered 2014-01-08: 100 mg via INTRAVENOUS

## 2014-01-08 MED ORDER — HYDROMORPHONE HCL 1 MG/ML IJ SOLN
1.0000 mg | INTRAMUSCULAR | Status: DC | PRN
Start: 2014-01-08 — End: 2014-01-10
  Administered 2014-01-09: 1 mg via INTRAVENOUS

## 2014-01-08 MED ORDER — OXYCODONE HCL 5 MG PO TABS: 5.0000 mg | ORAL_TABLET | Freq: Once | ORAL | Status: AC | PRN

## 2014-01-08 MED ORDER — DIPHENHYDRAMINE HCL 12.5 MG/5ML PO ELIX: 12.5000 mg | ORAL_SOLUTION | ORAL | Status: DC | PRN

## 2014-01-08 MED ORDER — LIDOCAINE HCL (CARDIAC) 20 MG/ML IV SOLN
INTRAVENOUS | Status: AC
  Filled 2014-01-08: qty 5

## 2014-01-08 MED ORDER — SODIUM CHLORIDE 0.9 % IJ SOLN
INTRAMUSCULAR | Status: AC
  Filled 2014-01-08: qty 10

## 2014-01-08 MED ORDER — LIDOCAINE HCL (CARDIAC) 20 MG/ML IV SOLN
INTRAVENOUS | Status: DC | PRN
  Administered 2014-01-08: 80 mg via INTRAVENOUS

## 2014-01-08 MED ORDER — ALUM & MAG HYDROXIDE-SIMETH 200-200-20 MG/5ML PO SUSP: 30.0000 mL | ORAL | Status: DC | PRN

## 2014-01-08 MED ORDER — METOCLOPRAMIDE HCL 5 MG/ML IJ SOLN: 5.0000 mg | Freq: Three times a day (TID) | INTRAMUSCULAR | Status: DC | PRN

## 2014-01-08 MED ORDER — FERROUS SULFATE 325 (65 FE) MG PO TABS
325.0000 mg | ORAL_TABLET | Freq: Two times a day (BID) | ORAL | Status: DC
  Administered 2014-01-08 – 2014-01-10 (×4): 325 mg via ORAL
  Filled 2014-01-08 (×5): qty 1

## 2014-01-08 MED ORDER — SODIUM CHLORIDE 0.9 % IR SOLN
Status: DC | PRN
  Administered 2014-01-08: 1000 mL

## 2014-01-08 MED ORDER — ROCURONIUM BROMIDE 50 MG/5ML IV SOLN
INTRAVENOUS | Status: AC
  Filled 2014-01-08: qty 1

## 2014-01-08 MED ORDER — METOCLOPRAMIDE HCL 10 MG PO TABS: 5.0000 mg | ORAL_TABLET | Freq: Three times a day (TID) | ORAL | Status: DC | PRN

## 2014-01-08 MED ORDER — OXYCODONE HCL 5 MG/5ML PO SOLN: 5.0000 mg | Freq: Once | ORAL | Status: AC | PRN

## 2014-01-08 MED ORDER — MIDAZOLAM HCL 5 MG/5ML IJ SOLN
INTRAMUSCULAR | Status: DC | PRN
  Administered 2014-01-08: 2 mg via INTRAVENOUS

## 2014-01-08 MED ORDER — INSULIN ASPART 100 UNIT/ML ~~LOC~~ SOLN: 0.0000 [IU] | Freq: Three times a day (TID) | SUBCUTANEOUS | Status: DC

## 2014-01-08 MED ORDER — SUCCINYLCHOLINE CHLORIDE 20 MG/ML IJ SOLN
INTRAMUSCULAR | Status: AC
  Filled 2014-01-08: qty 1

## 2014-01-08 MED ORDER — ONDANSETRON HCL 4 MG/2ML IJ SOLN: 4.0000 mg | Freq: Four times a day (QID) | INTRAMUSCULAR | Status: DC | PRN

## 2014-01-08 MED ORDER — ONDANSETRON HCL 4 MG/2ML IJ SOLN
INTRAMUSCULAR | Status: DC | PRN
  Administered 2014-01-08: 4 mg via INTRAVENOUS

## 2014-01-08 MED ORDER — CEFAZOLIN SODIUM-DEXTROSE 2-3 GM-% IV SOLR
2.0000 g | INTRAVENOUS | Status: AC
  Administered 2014-01-08: 2 g via INTRAVENOUS
  Filled 2014-01-08 (×2): qty 50

## 2014-01-08 MED ORDER — ACETAMINOPHEN 650 MG RE SUPP: 650.0000 mg | Freq: Four times a day (QID) | RECTAL | Status: DC | PRN

## 2014-01-08 MED ORDER — ACETAMINOPHEN 325 MG PO TABS
325.0000 mg | ORAL_TABLET | ORAL | Status: DC | PRN
  Administered 2014-01-08 – 2014-01-09 (×2): 650 mg via ORAL
  Filled 2014-01-08: qty 2

## 2014-01-08 MED ORDER — ONDANSETRON HCL 4 MG PO TABS: 4.0000 mg | ORAL_TABLET | Freq: Four times a day (QID) | ORAL | Status: DC | PRN

## 2014-01-08 MED ORDER — KETOROLAC TROMETHAMINE 15 MG/ML IJ SOLN
7.5000 mg | Freq: Four times a day (QID) | INTRAMUSCULAR | Status: AC
  Administered 2014-01-08 – 2014-01-09 (×3): 7.5 mg via INTRAVENOUS
  Filled 2014-01-08 (×3): qty 1

## 2014-01-08 MED ORDER — BUPROPION HCL ER (XL) 150 MG PO TB24
150.0000 mg | ORAL_TABLET | Freq: Every day | ORAL | Status: DC
  Administered 2014-01-08 – 2014-01-10 (×3): 150 mg via ORAL
  Filled 2014-01-08 (×3): qty 1

## 2014-01-08 SURGICAL SUPPLY — 84 items
BANDAGE ELASTIC 4 VELCRO ST LF (GAUZE/BANDAGES/DRESSINGS) ×3 IMPLANT
BANDAGE ELASTIC 6 VELCRO ST LF (GAUZE/BANDAGES/DRESSINGS) ×3 IMPLANT
BANDAGE ESMARK 6X9 LF (GAUZE/BANDAGES/DRESSINGS) ×1 IMPLANT
BEARING TIBIAL SZ 3 MED 3 (Knees) ×2 IMPLANT
BEARING TIBIAL SZ 3 MED 3MM (Knees) ×1 IMPLANT
BLADE SAG 18X100X1.27 (BLADE) ×3 IMPLANT
BLADE SURG 10 STRL SS (BLADE) ×18 IMPLANT
BNDG COHESIVE 4X5 TAN STRL (GAUZE/BANDAGES/DRESSINGS) ×3 IMPLANT
BNDG ESMARK 6X9 LF (GAUZE/BANDAGES/DRESSINGS) ×3
BNDG GAUZE ELAST 4 BULKY (GAUZE/BANDAGES/DRESSINGS) ×3 IMPLANT
BOOTCOVER CLEANROOM LRG (PROTECTIVE WEAR) ×6 IMPLANT
BOWL SMART MIX CTS (DISPOSABLE) ×3 IMPLANT
CATH BROVIAC DAVOL PORT 4.2FR (CATHETERS) ×3 IMPLANT
CONT SPEC 4OZ CLIKSEAL STRL BL (MISCELLANEOUS) ×15 IMPLANT
COVER SURGICAL LIGHT HANDLE (MISCELLANEOUS) ×3 IMPLANT
CUFF TOURNIQUET SINGLE 34IN LL (TOURNIQUET CUFF) IMPLANT
DRAPE EXTREMITY T 121X128X90 (DRAPE) ×3 IMPLANT
DRAPE IMP U-DRAPE 54X76 (DRAPES) ×3 IMPLANT
DRAPE PROXIMA HALF (DRAPES) ×3 IMPLANT
DRAPE U-SHAPE 47X51 STRL (DRAPES) ×3 IMPLANT
DRSG ADAPTIC 3X8 NADH LF (GAUZE/BANDAGES/DRESSINGS) ×3 IMPLANT
DRSG PAD ABDOMINAL 8X10 ST (GAUZE/BANDAGES/DRESSINGS) ×6 IMPLANT
DURAPREP 26ML APPLICATOR (WOUND CARE) ×3 IMPLANT
ELECT CAUTERY BLADE 6.4 (BLADE) ×3 IMPLANT
ELECT REM PT RETURN 9FT ADLT (ELECTROSURGICAL) ×3
ELECTRODE REM PT RTRN 9FT ADLT (ELECTROSURGICAL) ×1 IMPLANT
EVACUATOR 1/8 PVC DRAIN (DRAIN) IMPLANT
GAUZE SPONGE 4X4 12PLY STRL (GAUZE/BANDAGES/DRESSINGS) ×3 IMPLANT
GAUZE XEROFORM 1X8 LF (GAUZE/BANDAGES/DRESSINGS) ×3 IMPLANT
GAUZE XEROFORM 5X9 LF (GAUZE/BANDAGES/DRESSINGS) ×3 IMPLANT
GLOVE BIO SURGEON STRL SZ7.5 (GLOVE) ×9 IMPLANT
GLOVE BIO SURGEON STRL SZ8 (GLOVE) ×6 IMPLANT
GLOVE BIOGEL PI IND STRL 6.5 (GLOVE) ×2 IMPLANT
GLOVE BIOGEL PI IND STRL 8 (GLOVE) ×2 IMPLANT
GLOVE BIOGEL PI INDICATOR 6.5 (GLOVE) ×4
GLOVE BIOGEL PI INDICATOR 8 (GLOVE) ×4
GLOVE BIOGEL PI ORTHO PRO SZ8 (GLOVE) ×2
GLOVE ORTHO TXT STRL SZ7.5 (GLOVE) ×3 IMPLANT
GLOVE PI ORTHO PRO STRL SZ8 (GLOVE) ×1 IMPLANT
GLOVE SURG ORTHO 8.0 STRL STRW (GLOVE) ×6 IMPLANT
GLOVE SURG SS PI 7.0 STRL IVOR (GLOVE) ×9 IMPLANT
GOWN STRL REUS W/ TWL LRG LVL3 (GOWN DISPOSABLE) ×4 IMPLANT
GOWN STRL REUS W/TWL LRG LVL3 (GOWN DISPOSABLE) ×8
HANDPIECE INTERPULSE COAX TIP (DISPOSABLE) ×2
HOOD SURGICAL BLUE (PROTECTIVE WEAR) ×3 IMPLANT
IMMOBILIZER KNEE 22 UNIV (SOFTGOODS) IMPLANT
KIT BASIN OR (CUSTOM PROCEDURE TRAY) ×3 IMPLANT
KIT ROOM TURNOVER OR (KITS) ×3 IMPLANT
MANIFOLD NEPTUNE II (INSTRUMENTS) ×3 IMPLANT
NS IRRIG 1000ML POUR BTL (IV SOLUTION) ×3 IMPLANT
PACK LAMINECTOMY NEURO (CUSTOM PROCEDURE TRAY) ×3 IMPLANT
PACK ORTHO EXTREMITY (CUSTOM PROCEDURE TRAY) ×3 IMPLANT
PACK TOTAL JOINT (CUSTOM PROCEDURE TRAY) ×3 IMPLANT
PACK UNIVERSAL I (CUSTOM PROCEDURE TRAY) ×3 IMPLANT
PAD ARMBOARD 7.5X6 YLW CONV (MISCELLANEOUS) ×6 IMPLANT
PAD CAST 4YDX4 CTTN HI CHSV (CAST SUPPLIES) ×1 IMPLANT
PADDING CAST COTTON 4X4 STRL (CAST SUPPLIES) ×2
PADDING CAST COTTON 6X4 STRL (CAST SUPPLIES) ×3 IMPLANT
SET HNDPC FAN SPRY TIP SCT (DISPOSABLE) ×1 IMPLANT
SPONGE GAUZE 4X4 12PLY STER LF (GAUZE/BANDAGES/DRESSINGS) ×3 IMPLANT
SPONGE LAP 18X18 X RAY DECT (DISPOSABLE) ×3 IMPLANT
STAPLER VISISTAT 35W (STAPLE) ×3 IMPLANT
STOCKINETTE IMPERVIOUS 9X36 MD (GAUZE/BANDAGES/DRESSINGS) ×3 IMPLANT
SUCTION FRAZIER TIP 10 FR DISP (SUCTIONS) ×6 IMPLANT
SUT ETHILON 3 0 FSL (SUTURE) ×6 IMPLANT
SUT ETHILON 3 0 PS 1 (SUTURE) IMPLANT
SUT PDS AB 1 CTX 36 (SUTURE) ×3 IMPLANT
SUT VIC AB 1 CTX 36 (SUTURE) ×4
SUT VIC AB 1 CTX36XBRD ANBCTR (SUTURE) ×2 IMPLANT
SUT VIC AB 2-0 SH 27 (SUTURE) ×4
SUT VIC AB 2-0 SH 27XBRD (SUTURE) ×2 IMPLANT
SUT VIC AB 3-0 SH 27 (SUTURE)
SUT VIC AB 3-0 SH 27X BRD (SUTURE) IMPLANT
SWAB COLLECTION DEVICE MRSA (MISCELLANEOUS) ×3 IMPLANT
SYR 30ML LL (SYRINGE) ×3 IMPLANT
TOWEL OR 17X24 6PK STRL BLUE (TOWEL DISPOSABLE) ×3 IMPLANT
TOWEL OR 17X26 10 PK STRL BLUE (TOWEL DISPOSABLE) ×3 IMPLANT
TRAY FOLEY CATH 16FRSI W/METER (SET/KITS/TRAYS/PACK) IMPLANT
TUBE ANAEROBIC SPECIMEN COL (MISCELLANEOUS) ×3 IMPLANT
TUBE CONNECTING 12'X1/4 (SUCTIONS) ×1
TUBE CONNECTING 12X1/4 (SUCTIONS) ×2 IMPLANT
UNDERPAD 30X30 INCONTINENT (UNDERPADS AND DIAPERS) ×3 IMPLANT
WATER STERILE IRR 1000ML POUR (IV SOLUTION) ×9 IMPLANT
YANKAUER SUCT BULB TIP NO VENT (SUCTIONS) ×6 IMPLANT

## 2014-01-08 NOTE — Discharge Instructions (Signed)
Diet: As you were doing prior to hospitalization  ° °Shower:  May shower but keep the wounds dry, use an occlusive plastic wrap, NO SOAKING IN TUB.  If the bandage gets wet, change with a clean dry gauze. ° °Dressing:  You may change your dressing 3-5 days after surgery.  Then change the dressing daily with sterile gauze dressing.   ° °There are sticky tapes (steri-strips) on your wounds and all the stitches are absorbable.  Leave the steri-strips in place when changing your dressings, they will peel off with time, usually 2-3 weeks. ° °Activity:  Increase activity slowly as tolerated, but follow the weight bearing instructions below.  No lifting or driving for 6 weeks. ° °Weight Bearing:   As tolerated.   ° °To prevent constipation: you may use a stool softener such as - ° °Colace (over the counter) 100 mg by mouth twice a day  °Drink plenty of fluids (prune juice may be helpful) and high fiber foods °Miralax (over the counter) for constipation as needed.   ° °Itching:  If you experience itching with your medications, try taking only a single pain pill, or even half a pain pill at a time.  You may take up to 10 pain pills per day, and you can also use benadryl over the counter for itching or also to help with sleep.  ° °Precautions:  If you experience chest pain or shortness of breath - call 911 immediately for transfer to the hospital emergency department!! ° °If you develop a fever greater that 101 F, purulent drainage from wound, increased redness or drainage from wound, or calf pain -- Call the office at [phone]                                                °Follow- Up Appointment:  Please call for an appointment to be seen in 2 weeks [place] - [phone] ° ° ° ° ° °

## 2014-01-08 NOTE — Progress Notes (Signed)
Orthopedic postop check  Johnathan RuizJohn is doing well without complaints, pain controlled.  He shared with me some additional history, that he has had a past history of a staph infection that was about 30 years ago, and then had a girlfriend who had recurrent MRSA about 7 years ago. She had multiple episodes of skin boils erupting, and was treated for MRSA.  I have counseled him that I am going to be ordering an MRI of his left knee to evaluate the possible lucency seen on plain x-rays, and to ensure that we do not have infection in the bone itself. I am concerned because his plain x-rays demonstrated fair amount of collapse on the lateral side, which may either be from overstuffing the medial side with a component slightly too large, or alternatively from a destructive erosive process within the joint itself. Additionally, the MRI had an area of lucency concerning for possible osteomyelitis on the medial aspect of the lateral femoral condyle.  If the MRI demonstrates evidence for osteomyelitis, then we will plan to take him back for revision surgical intervention with explantation of his implant, and we will also perform the bony cuts necessary for total knee replacement, serving as the osteomyelitis debridement. We will also do a cement interposition spacer if in fact this is the case.  I'm holding off judgment on this until we get further information with the MRI, hopefully the MRI will be negative for osteomyelitis, in which case we will continue with the IV treatment of his infection.  This is all a very difficult and challenging situation, and hopefully we will achieve a functional long-term outcome.  Eulas PostLANDAU,Eilis Chestnutt P, MD

## 2014-01-08 NOTE — Anesthesia Postprocedure Evaluation (Signed)
  Anesthesia Post-op Note  Patient: Johnathan BaltimoreJohn E Barr  Procedure(s) Performed: Procedure(s): IRRIGATION AND DEBRIDEMENT EXTREMITY LEFT KNEE (Left) LEFT KNEE IRRIGATION & DEBRIDEMENT WITH EXCHANGE OF POLYETHALINE  (Left)  Patient Location: PACU  Anesthesia Type:General  Level of Consciousness: awake and alert   Airway and Oxygen Therapy: Patient Spontanous Breathing  Post-op Pain: mild  Post-op Assessment: Post-op Vital signs reviewed, Patient's Cardiovascular Status Stable, Respiratory Function Stable, Patent Airway, No signs of Nausea or vomiting and Pain level controlled  Post-op Vital Signs: Reviewed and stable  Last Vitals:  Filed Vitals:   01/08/14 1444  BP: 116/80  Pulse: 67  Temp: 37.2 C  Resp: 12    Complications: No apparent anesthesia complications

## 2014-01-08 NOTE — Transfer of Care (Signed)
Immediate Anesthesia Transfer of Care Note  Patient: Johnathan BaltimoreJohn E Barr  Procedure(s) Performed: Procedure(s): IRRIGATION AND DEBRIDEMENT EXTREMITY LEFT KNEE (Left) LEFT KNEE IRRIGATION & DEBRIDEMENT WITH EXCHANGE OF POLYETHALINE  (Left)  Patient Location: PACU  Anesthesia Type:General  Level of Consciousness: awake, alert , oriented and sedated  Airway & Oxygen Therapy: Patient Spontanous Breathing and Patient connected to nasal cannula oxygen  Post-op Assessment: Report given to PACU RN, Post -op Vital signs reviewed and stable and Patient moving all extremities  Post vital signs: Reviewed and stable  Complications: No apparent anesthesia complications

## 2014-01-08 NOTE — Progress Notes (Signed)
Orthopedic Tech Progress Note Patient Details:  Patsy BaltimoreJohn E Ashraf 11/03/1960 [id number]  Ortho Devices Type of Ortho Device: Knee Immobilizer Ortho Device/Splint Location: lle Ortho Device/Splint Interventions: Application RN now states that pt cannot retrieve knee immobilizer from home therefore ortho has provided it  Desirre Eickhoff 01/08/2014, 5:00 PM

## 2014-01-08 NOTE — Op Note (Signed)
01/07/2014 - 01/08/2014  10:12 AM  PATIENT:  Johnathan Barr    PRE-OPERATIVE DIAGNOSIS:  Infected left knee unicompartmental arthroplasty  POST-OPERATIVE DIAGNOSIS:  Same  PROCEDURE:  Left knee irrigation and debridement with extensive synovectomy and exchange of polyethylene medial mobile bearing  SURGEON:  Eulas PostLANDAU,Rahmon Heigl P, MD  PHYSICIAN ASSISTANT: Janace LittenBrandon Parry, OPA-C, present and scrubbed throughout the case, critical for completion in a timely fashion, and for retraction, instrumentation, and closure.  Specimens:I sent 5 intraoperative tissue cultures.  ANESTHESIA:   General  PREOPERATIVE INDICATIONS:  Johnathan Barr is a  53 y.o. male who had a left partial knee replacement performed 3 months ago. He had persistent postoperative stiffness, had one episode of drainage, which resolved with a silver nitrate application, had no redness, but persistent pain and stiffness, low-grade fevers of 100.0, but failed to respond to conservative measures. He ultimately was diagnosed with a septic left knee after ultrasound demonstrated fluid and successful aspiration demonstrated over 100,000 white blood cells and fluid. He also had a preoperative's MRSA screen which demonstrated both staph aureus and MRSA on his skin. I had already done to previous knee aspirations, which did not yield any fluid, but the aspiration under ultrasound guidance did find fluid. Given the constellation of clinical signs, I recommended urgent surgical debridement with polyethylene exchange versus cement interposition arthroplasty. I discussed these options with him, including the potential that if the polyethylene exchange is not successful that he may require revision I&D with cement interposition arthroplasty and staged reimplantation.  The risks benefits and alternatives were discussed with the patient preoperatively including but not limited to the risks of infection, bleeding, nerve injury, cardiopulmonary complications,  the need for revision surgery, among others, and the patient was willing to proceed.we also discussed the risks for the development of osteomyelitis, the potential need for future revision surgery, among others.  OPERATIVE IMPLANTS: I removed a size 4 mm mobile bearing size medium Oxford polyethylene implant, and I replaced this with a size 3 mm mobile bearing insert.  OPERATIVE FINDINGS: there was a moderate amount of purulence and thickened tissue within the synovium throughout the knee. I also found the moderate sized piece of cement on the medial aspect of the knee, and removed this area and I recognize this from the postoperative x-rays after his first surgery. The cartilage over the patella as well as the lateral compartment was still intact, only some mild changes on the femoral trochlea. Interestingly, he was a fair bit tighter on the posterior aspect of the medial femur in flexion, and his extension gap was looser, nonetheless I did downsize to the 3 given his previous stiffness and difficulty with function, as well as remembering the degree of difficulty I had placing the polyethylene in flexion during his first operation. I did not find any osteomyelitis within the bone itself. There was purulence within the deep compartment of the knee around the implant. The implants themselves were solidly fixed, without evidence for loosening.  OPERATIVE PROCEDURE: the patient is brought to the operating room and placed in supine position. Gen. Anesthesia was administered. IV antibiotics were held until after the completion of these synovectomy and the components were removed. I did give him vancomycin and Ancef after I released the tourniquet.  The leg was prepped and draped in usual sterile fashion using DuraPrep and the leg was elevated and exsanguinated and the tourniquet was inflated. Time out was performed. Anterior incision was made and I extended this incision both proximally and  distally for better  exposure. Medial parapatellar arthrotomy was carried out, and I performed a very aggressive synovectomy on both the medial and lateral compartments, and I excised the fat pad, and exposed the femur. I removed any fibrinous scar tissue over the drill hole for the femoral guide, as well as over the anterior flanged recess for the extension gap.  I used the mini C-arm to localize the loose pieces of cement, that was on the medial side, and this was removed that difficulty, but this was a fairly sizable piece.  I also removed the polyethylene using an osteotome, and used a curette to debride the posterior compartment of the knee. I irrigated a total of 9 L through his tissues, and exchanged instruments and got new gloves. I also applied another layer of drapes. I then placed the new polyethylene, and it was found that good stability although it was somewhat more loose than before, particularly in extension, but had good conformity and tracked well and so I maintained the size 3, tibia given his previous stiffness.  I released the tourniquet, gave the IV antibiotics, and then closed the capsule with #1 PDS followed by nylon for the skin. I placed 2 deep drains.  He tolerated the procedure well and there were no complications. I will put him on IV vancomycin and Ancef and get an infectious disease consult. He will also need a PICC line. Hopefully this operation will be successful, otherwise he will need staged explantation and reimplantation arthroplasty.

## 2014-01-08 NOTE — Consult Note (Addendum)
[facility] for Infectious Disease     Reason for Consult: PJI    Referring Physician: Dr. Mardelle Matte  Principal Problem:   Infected prosthetic knee joint, left   . buPROPion  150 mg Oral Daily  . Chlorhexidine Gluconate Cloth  6 each Topical Q0600  . ferrous sulfate  325 mg Oral BID WC  . lisinopril  10 mg Oral Daily   And  . hydrochlorothiazide  12.5 mg Oral Daily  . ketorolac  7.5 mg Intravenous 4 times per day  . metoprolol succinate  50 mg Oral Daily  . mupirocin ointment  1 application Nasal BID  . pantoprazole  20 mg Oral Daily  . vancomycin        Recommendations: Vancomycin and ceftriaxone pending cultures   Dr Baxter Flattery to follow up tomorrow  Assessment: PJI s/p polyexchange   Antibiotics: Amoxicillin x 1 dose 1 week ago prior to dental work  HPI: Johnathan Barr is a 53 y.o. male with left partial knee replacement 3 months ago with post op stiffness, some drainage and monitored.  He continued to have low grade fevers and aspiration of the knee with over 100,000 WBCs with 96%, neutrophils, gram stain negative.  CRP 8.8, ESR 100.  +MRSA screen.  In OR, noted pus and underwent I and D with polyexchange and synovectomy.     Review of Systems: Review of systems not obtained due to patient factors.  Past Medical History  Diagnosis Date  . Colon polyps     2  . Osteoarthritis of left knee 09/16/2013  . Diabetes mellitus   . Hypertension   . Hyperlipidemia   . Hyperthyroidism   . Infected prosthetic knee joint, left 01/08/2014    History  Substance Use Topics  . Smoking status: Former Smoker -- 0.25 packs/day    Quit date: 09/12/2012  . Smokeless tobacco: Never Used  . Alcohol Use: 0.0 oz/week    0 Not specified per week     Comment: almost daily    Family History  Problem Relation Age of Onset  . Ovarian cancer Mother   . Heart disease Father     CAD  . Lung cancer Paternal Uncle   . Lymphoma Cousin    No Known Allergies  OBJECTIVE: Blood  pressure 127/82, pulse 68, temperature 99.1 F (37.3 C), temperature source Oral, resp. rate 14, height 6' (1.829 m), weight 230 lb (104.327 kg), SpO2 98 %. General: awake, post op and not able to answer questions much Skin: no rashes Lungs: CTA B Cor: RRR Abdomen: soft, nt, nd  Microbiology: Recent Results (from the past 240 hour(s))  Body fluid culture     Status: None (Preliminary result)   Collection Time: 01/06/14  4:51 PM  Result Value Ref Range Status   Gram Stain Abundant  Preliminary   Gram Stain WBC present-predominately Mononuclear  Preliminary   Gram Stain No Organisms Seen  Preliminary   Preliminary Report NO GROWTH  Preliminary  Surgical PCR screen     Status: Abnormal   Collection Time: 01/07/14  5:13 PM  Result Value Ref Range Status   MRSA, PCR POSITIVE (A) NEGATIVE Final   Staphylococcus aureus POSITIVE (A) NEGATIVE Final    Comment:        The Xpert SA Assay (FDA approved for NASAL specimens in patients over 36 years of age), is one component of a comprehensive surveillance program.  Test performance has been validated by [employer] for patients greater than or  equal to 71 year old. It is not intended to diagnose infection nor to guide or monitor treatment.     Scharlene Gloss, [facility] [place] [email or web address] [phone] pager  [phone] cell 01/08/2014, 1:06 PM

## 2014-01-08 NOTE — H&P (Signed)
PREOPERATIVE H&P  Chief Complaint: INFECTED LEFT KNEE REPLACEMENT  HPI: Johnathan Barr is a 53 y.o. male who presents for preoperative history and physical with a diagnosis of INFECTED LEFT KNEE. Symptoms are rated as moderate to severe, and have been worsening.  This is significantly impairing activities of daily living.  He has elected for surgical management. His total knee replacement was about 3 months ago, and he has struggled with motion. He has never had any true redness around his knee, he has had swelling and stiffness. We did a knee manipulation recently. I have tried aspirate him twice, without being able to get any fluid. Last week on Friday, his primary care doctor was able to find fluid under ultrasound, and aspirate. Ligament material from his knee. His sedimentation rate and CRP have also been mildly elevated, but are now arising. He has not had true fevers, however has had night sweats. His wound had healed without complication, although at one of his early postoperative visit he had a hypertrophic granulation area with some slight drainage, which completely closed with silver nitrate application.  Into doesn't and 2 he had a left knee arthroscopy that healed uneventfully, he return to work 2 days later without difficulty.    Past Medical History  Diagnosis Date  . Colon polyps     2  . Osteoarthritis of left knee 09/16/2013  . Diabetes mellitus   . Hypertension   . Hyperlipidemia   . Hyperthyroidism    Past Surgical History  Procedure Laterality Date  . Knee arthroscopy w/ medial collateral ligament (mcl) repair  2002    left  . Colonoscopy    . Partial knee arthroplasty Left 09/16/2013    Procedure: LEFT KNEE: ARTHROPLASTY KNEE CONDYLE AND PLATEAU MEDIAL COMPARTMENT;  Surgeon: Eulas PostJoshua P Aitana Burry, MD;  Location: [facility];  Service: Orthopedics;  Laterality: Left;   History   Social History  . Marital Status: Divorced    Spouse Name: N/A    Number of  Children: 3  . Years of Education: N/A   Occupational History  .      Retail bankerAircraft mechanic   Social History Main Topics  . Smoking status: Former Smoker -- 0.25 packs/day    Quit date: 09/12/2012  . Smokeless tobacco: Never Used  . Alcohol Use: 0.0 oz/week    0 Not specified per week     Comment: almost daily  . Drug Use: No  . Sexual Activity: Yes    Birth Control/ Protection: None     Comment: vapo   Other Topics Concern  . Not on file   Social History Narrative   You have been given a separate informational sheet regarding your tobacco use, the importance of quitting and local resources to help you quit.         Family History  Problem Relation Age of Onset  . Ovarian cancer Mother   . Heart disease Father     CAD  . Lung cancer Paternal Uncle   . Lymphoma Cousin    No Known Allergies Prior to Admission medications   Medication Sig Start Date End Date Taking? Authorizing Provider  buPROPion (WELLBUTRIN XL) 150 MG 24 hr tablet TAKE 1 TABLET (150 MG TOTAL) BY MOUTH DAILY. 08/08/13  Yes Monica Bectonhomas J Thekkekandam, MD  cyclobenzaprine (FLEXERIL) 10 MG tablet One half tab PO qHS, then increase gradually to one tab TID. 09/16/13  Yes Eulas PostJoshua P Susano Cleckler, MD  ferrous sulfate 325 (65 FE) MG EC tablet  Take 1 tablet (325 mg total) by mouth 3 (three) times daily with meals. Patient taking differently: Take 325 mg by mouth 2 (two) times daily.  11/04/13  Yes Monica Bectonhomas J Thekkekandam, MD  GLUMETZA 1000 MG 24 hr tablet TAKE 1 TABLET BY MOUTH DAILY WITH BREAKFAST. 11/27/13  Yes Monica Bectonhomas J Thekkekandam, MD  ketorolac (TORADOL) 10 MG tablet Take 1 tablet (10 mg total) by mouth every 8 (eight) hours as needed. 01/06/14  Yes Monica Bectonhomas J Thekkekandam, MD  lisinopril-hydrochlorothiazide (PRINZIDE,ZESTORETIC) 10-12.5 MG per tablet TAKE 1 TABLET BY MOUTH DAILY. 01/03/14  Yes Monica Bectonhomas J Thekkekandam, MD  metoprolol succinate (TOPROL XL) 50 MG 24 hr tablet Take 1 tablet (50 mg total) by mouth daily. Take with or  immediately following a meal. 11/04/13  Yes Monica Bectonhomas J Thekkekandam, MD  oxiconazole (OXISTAT) 1 % lotion Apply to affected area daily 08/30/12  Yes Monica Bectonhomas J Thekkekandam, MD  oxyCODONE-acetaminophen (PERCOCET) 10-325 MG per tablet Take 1-2 tablets by mouth every 6 (six) hours as needed for pain. MAXIMUM TOTAL ACETAMINOPHEN DOSE IS 4000 MG PER DAY 09/16/13  Yes Eulas PostJoshua P Bonnye Halle, MD  pantoprazole (PROTONIX) 20 MG tablet Take 20 mg by mouth daily.   Yes Historical Provider, MD  pantoprazole (PROTONIX) 20 MG tablet Take 2 tablets (40 mg total) by mouth daily. Patient taking differently: Take 20 mg by mouth daily.  08/30/12   Monica Bectonhomas J Thekkekandam, MD     Positive ROS: All other systems have been reviewed and were otherwise negative with the exception of those mentioned in the HPI and as above.  Physical Exam: General: Alert, no acute distress Cardiovascular: No pedal edema Respiratory: No cyanosis, no use of accessory musculature GI: No organomegaly, abdomen is soft and non-tender Skin: No lesions in the area of chief complaint Neurologic: Sensation intact distally Psychiatric: Patient is competent for consent with normal mood and affect Lymphatic: No axillary or cervical lymphadenopathy  MUSCULOSKELETAL: left knee has moderate soft tissue swelling, range of motion 5 to 60, surgical wounds are closed, mild warmth, no redness or cellulitis.  Assessment: Infected left partial knee replacement  Plan: Plan for Procedure(s): Left knee irrigation and debridement with polyethylene exchange, possible cement interposition arthroplasty  The risks benefits and alternatives were discussed with the patient including but not limited to the risks of nonoperative treatment, versus surgical intervention including infection, bleeding, nerve injury,  blood clots, cardiopulmonary complications, morbidity, mortality, among others, and they were willing to proceed. We have also discussed the risks for recurrent  infection, need for future surgical intervention with revision operations, the possibility that we may do a cement interposition arthroplasty today and light of the positive MRSA screen, as well as the severity of his infection. We will make this determination intraoperatively upon evaluation of the parts. He may also need multiple surgical debridements.  He will also need PICC line placement, infectious disease consultation, and will be in the hospital for a couple of days until we get all this organized. We will hold antibiotics until after we achieve intraoperative cultures.  Eulas PostLANDAU,Li Bobo P, MD Cell [phone]   01/08/2014 7:35 AM

## 2014-01-08 NOTE — Anesthesia Procedure Notes (Signed)
Procedure Name: Intubation Date/Time: 01/08/2014 8:20 AM Performed by: Fransisca KaufmannMEYER, Antwine Agosto E Pre-anesthesia Checklist: Patient identified and Emergency Drugs available Patient Re-evaluated:Patient Re-evaluated prior to inductionOxygen Delivery Method: Circle system utilized Preoxygenation: Pre-oxygenation with 100% oxygen Intubation Type: IV induction Ventilation: Mask ventilation without difficulty LMA: LMA inserted LMA Size: 4.0 Number of attempts: 1 Placement Confirmation: positive ETCO2 and breath sounds checked- equal and bilateral Tube secured with: Tape

## 2014-01-08 NOTE — Progress Notes (Signed)
ANTIBIOTIC CONSULT NOTE - INITIAL  Pharmacy Consult for Vancomycin/Rocephin Indication: Infected knee joint  No Known Allergies  Patient Measurements: Height: 6' (182.9 cm) Weight: 230 lb (104.327 kg) IBW/kg (Calculated) : 77.6  Vital Signs: Temp: 99.1 F (37.3 C) (11/15 1222) Temp Source: Oral (11/15 1222) BP: 127/82 mmHg (11/15 1222) Pulse Rate: 68 (11/15 1222) Intake/Output from previous day: 11/14 0701 - 11/15 0700 In: 120 [P.O.:120] Out: -  Intake/Output from this shift: Total I/O In: 1500 [I.V.:1500] Out: 100 [Blood:100]  Labs:  Recent Labs  01/06/14 1000 01/07/14 1824  WBC 11.1* 7.6  HGB 13.1 12.3*  PLT 333 295  CREATININE 1.00 0.87   Estimated Creatinine Clearance: 119.8 mL/min (by C-G formula based on Cr of 0.87).  Microbiology: Recent Results (from the past 720 hour(s))  Body fluid culture     Status: None (Preliminary result)   Collection Time: 01/06/14  4:51 PM  Result Value Ref Range Status   Gram Stain Abundant  Preliminary   Gram Stain WBC present-predominately Mononuclear  Preliminary   Gram Stain No Organisms Seen  Preliminary   Preliminary Report NO GROWTH  Preliminary  Surgical PCR screen     Status: Abnormal   Collection Time: 01/07/14  5:13 PM  Result Value Ref Range Status   MRSA, PCR POSITIVE (A) NEGATIVE Final   Staphylococcus aureus POSITIVE (A) NEGATIVE Final    Comment:        The Xpert SA Assay (FDA approved for NASAL specimens in patients over 921 years of age), is one component of a comprehensive surveillance program.  Test performance has been validated by [facility] for patients greater than or equal to 55 year old. It is not intended to diagnose infection nor to guide or monitor treatment.    Medical History: Past Medical History  Diagnosis Date  . Colon polyps     2  . Osteoarthritis of left knee 09/16/2013  . Diabetes mellitus   . Hypertension   . Hyperlipidemia   . Hyperthyroidism   . Infected prosthetic  knee joint, left 01/08/2014   Medications:  Anti-infectives    Start     Dose/Rate Route Frequency Ordered Stop   01/08/14 0800  vancomycin (VANCOCIN) IVPB 1000 mg/200 mL premix     1,000 mg200 mL/hr over 60 Minutes Intravenous To Surgery 01/08/14 0746 01/08/14 1000   01/08/14 0800  ceFAZolin (ANCEF) IVPB 2 g/50 mL premix     2 g100 mL/hr over 30 Minutes Intravenous To Surgery 01/08/14 0746 01/08/14 0955   01/08/14 0754  vancomycin (VANCOCIN) 1 GM/200ML IVPB    Comments:  Donette LarryMeyer, Mary   : cabinet override      01/08/14 0754 01/08/14 1959     Assessment: 53yo male s/p knee replacement a few months ago and has had persistent stiffness and low grade fever.  He is admitted for debridement and we have been asked to provide antibiotic dosing of Ceftriaxone and Vancomycin.  He received a preop dose of Vancomycin and Cefazolin around 10AM without noted complications.  His creatinine is normal at 1.0 with an estimated crcl of 119 ml/min.    Goal of Therapy:  Vancomycin trough level 15-20 mcg/ml  Plan:  1.  Vancomycin 1gm IV every 12 hours 2.  Ceftriaxone 2gm every 24 hours 3.  Will monitor clinical response, culture data and planned LOT.  Nadara MustardNita Neale Marzette, PharmD., MS Clinical Pharmacist Pager:  [phone] Thank you for allowing pharmacy to be part of this patients care team.  01/08/2014,1:31 PM

## 2014-01-08 NOTE — Progress Notes (Signed)
Orthopedic Tech Progress Note Patient Details:  Johnathan BaltimoreJohn E Burnsed 10/30/1960 [id number]  Patient ID: Johnathan Barr, male   DOB: 02/10/1961, 53 y.o.   MRN: [id number] RN stated that pt already has a knee immobilizer  Nikki DomCrawford, Felisia Balcom 01/08/2014, 4:40 PM

## 2014-01-08 NOTE — Anesthesia Preprocedure Evaluation (Signed)
Anesthesia Evaluation  Patient identified by MRN, date of birth, ID band Patient awake    Reviewed: Allergy & Precautions, H&P , NPO status , Patient's Chart, lab work & pertinent test results, reviewed documented beta blocker date and time   History of Anesthesia Complications Negative for: history of anesthetic complications  Airway Mallampati: I  TM Distance: >3 FB Neck ROM: Full    Dental  (+) Teeth Intact   Pulmonary neg shortness of breath, neg sleep apnea, neg COPDneg recent URI, former smoker,  breath sounds clear to auscultation        Cardiovascular hypertension, Pt. on medications and Pt. on home beta blockers Rhythm:Regular     Neuro/Psych  Neuromuscular disease negative psych ROS   GI/Hepatic Neg liver ROS, GERD-  Medicated and Controlled,  Endo/Other  diabetesMorbid obesity  Renal/GU negative Renal ROS     Musculoskeletal  (+) Arthritis -, Infected partial left knee   Abdominal   Peds  Hematology negative hematology ROS (+)   Anesthesia Other Findings   Reproductive/Obstetrics                             Anesthesia Physical Anesthesia Plan  ASA: II  Anesthesia Plan: General   Post-op Pain Management:    Induction: Intravenous  Airway Management Planned: LMA  Additional Equipment: None  Intra-op Plan:   Post-operative Plan: Extubation in OR  Informed Consent: I have reviewed the patients History and Physical, chart, labs and discussed the procedure including the risks, benefits and alternatives for the proposed anesthesia with the patient or authorized representative who has indicated his/her understanding and acceptance.   Dental advisory given  Plan Discussed with: CRNA and Surgeon  Anesthesia Plan Comments:         Anesthesia Quick Evaluation

## 2014-01-09 ENCOUNTER — Inpatient Hospital Stay (HOSPITAL_COMMUNITY): Payer: Managed Care, Other (non HMO)

## 2014-01-09 ENCOUNTER — Encounter: Payer: Managed Care, Other (non HMO) | Admitting: Physical Therapy

## 2014-01-09 ENCOUNTER — Encounter (HOSPITAL_COMMUNITY): Payer: Self-pay | Admitting: *Deleted

## 2014-01-09 LAB — CBC
HEMATOCRIT: 32.9 % — AB (ref 39.0–52.0)
Hemoglobin: 10.7 g/dL — ABNORMAL LOW (ref 13.0–17.0)
MCH: 30.2 pg (ref 26.0–34.0)
MCHC: 32.5 g/dL (ref 30.0–36.0)
MCV: 92.9 fL (ref 78.0–100.0)
PLATELETS: 267 10*3/uL (ref 150–400)
RBC: 3.54 MIL/uL — ABNORMAL LOW (ref 4.22–5.81)
RDW: 14.1 % (ref 11.5–15.5)
WBC: 7.4 10*3/uL (ref 4.0–10.5)

## 2014-01-09 LAB — GLUCOSE, CAPILLARY
Glucose-Capillary: 113 mg/dL — ABNORMAL HIGH (ref 70–99)
Glucose-Capillary: 144 mg/dL — ABNORMAL HIGH (ref 70–99)
Glucose-Capillary: 142 mg/dL — ABNORMAL HIGH (ref 70–99)
Glucose-Capillary: 126 mg/dL — ABNORMAL HIGH (ref 70–99)

## 2014-01-09 LAB — BASIC METABOLIC PANEL
Anion gap: 10 (ref 5–15)
BUN: 14 mg/dL (ref 6–23)
CO2: 29 mEq/L (ref 19–32)
Calcium: 9.1 mg/dL (ref 8.4–10.5)
Chloride: 100 mEq/L (ref 96–112)
Creatinine, Ser: 1.19 mg/dL (ref 0.50–1.35)
GFR calc non Af Amer: 67 mL/min — ABNORMAL LOW (ref >90)
GFR, EST AFRICAN AMERICAN: 78 mL/min — AB (ref >90)
Glucose, Bld: 125 mg/dL — ABNORMAL HIGH (ref 70–99)
POTASSIUM: 5.1 meq/L (ref 3.7–5.3)
Sodium: 139 mEq/L (ref 137–147)

## 2014-01-09 MED ORDER — SODIUM CHLORIDE 0.45 % IV SOLN: INTRAVENOUS | Status: DC

## 2014-01-09 MED ORDER — HYDROMORPHONE HCL 1 MG/ML IJ SOLN
INTRAMUSCULAR | Status: AC
  Filled 2014-01-09: qty 1

## 2014-01-09 MED ORDER — SODIUM CHLORIDE 0.9 % IJ SOLN
10.0000 mL | INTRAMUSCULAR | Status: DC | PRN
  Administered 2014-01-09 – 2014-01-10 (×2): 10 mL
  Filled 2014-01-09 (×2): qty 40

## 2014-01-09 MED ORDER — GADOBENATE DIMEGLUMINE 529 MG/ML IV SOLN
20.0000 mL | Freq: Once | INTRAVENOUS | Status: AC | PRN
  Administered 2014-01-09: 20 mL via INTRAVENOUS

## 2014-01-09 NOTE — Care Management Note (Signed)
CARE MANAGEMENT NOTE 01/09/2014  Patient:  Johnathan Barr,Johnathan Barr   Account Number:  [id number]  Date Initiated:  01/09/2014  Documentation initiated by:  Vance PeperBRADY,Caliyah Sieh  Subjective/Objective Assessment:   53 yr old male admitted with infected left knee post replacement. Patient had a Left knee I & D with extensive synovectomy and poly exchange.     Action/Plan:   Case manager spoke with patient concerning home health and DME needs. Patient will have IV antibiotics for 6-8weeks. Choice offered. Referral called to [facility] HerbMiranda C, [facility]. Patient has assistance at discharge.   Anticipated DC Date:  01/11/2014   Anticipated DC Plan:  HOME W HOME HEALTH SERVICES      DC Planning Services  CM consult      PAC Choice  DURABLE MEDICAL EQUIPMENT  HOME HEALTH   Choice offered to / List presented to:  C-1 Patient   DME arranged  [facility]      DME agency  [facility]     [facility]  HH-1 RN  HH-2 PT  IV Antibiotics      HH agency  [facility]   Status of service:  Completed, signed off Medicare Important Message given?   (If response is "NO", the following Medicare IM given date fields will be blank) Date Medicare IM given:   Medicare IM given by:   Date Additional Medicare IM given:   Additional Medicare IM given by:    Discharge Disposition:  HOME W HOME HEALTH SERVICES  Per UR Regulation:  Reviewed for med. necessity/level of care/duration of stay

## 2014-01-09 NOTE — Evaluation (Signed)
OT Cancellation Note  Patient Details Name: Patsy BaltimoreJohn E Bradshaw MRN: [id number] DOB: 01/11/1961   Cancelled Treatment:     Pt is currently off the floor for testing. Will check back as able for OT evaluation and assessment.  [address], Alhassan Everingham [address]Beth Dixon  Bekah Igoe GramlingBeth Dixon, [place] 01/09/2014, 11:39 AM

## 2014-01-09 NOTE — Progress Notes (Signed)
Utilization review completed.  

## 2014-01-09 NOTE — Evaluation (Signed)
Physical Therapy Evaluation Patient Details Name: Johnathan Barr MRN: [id number] DOB: 07/24/1960 Today's Date: 01/09/2014   History of Present Illness  Patient is a 53 yo male admitted 01/07/14 with infection of Lt partial knee replacement.  Patient now s/p I&D and replacement of components.  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  Will benefit from acute PT to maximize independence prior to discharge home.  Patient somewhat impulsive and wants to "do things his way".  Will need to review safety information with patient.  Recommend HHPT at discharge.    Follow Up Recommendations Home health PT;Supervision for mobility/OOB    Equipment Recommendations  None recommended by PT    Recommendations for Other Services       Precautions / Restrictions Precautions Precautions: Knee Required Braces or Orthoses: Knee Immobilizer - Left Knee Immobilizer - Left: On when out of bed or walking Restrictions Weight Bearing Restrictions: Yes LLE Weight Bearing: Weight bearing as tolerated      Mobility  Bed Mobility Overal bed mobility: Needs Assistance Bed Mobility: Supine to Sit;Sit to Supine     Supine to sit: Min guard Sit to supine: Min guard   General bed mobility comments: Instructed patient on use of KI and how to don/doff. Verbal cues for safe technique for bed mobility.  Patient refused assistance to move LLE off of and onto bed.  At end of session, removed KI at EOB and used his cane to bring LLE onto bed.  Transfers Overall transfer level: Needs assistance Equipment used: Rolling walker (2 wheeled) Transfers: Sit to/from Stand Sit to Stand: Min guard         General transfer comment: Verbal cues for hand placement and technique.  Assist for safety only.  Ambulation/Gait Ambulation/Gait assistance: Min guard Ambulation Distance (Feet): 30 Feet Assistive device: Rolling walker (2  wheeled) Gait Pattern/deviations: Step-to pattern;Decreased stance time - left;Decreased step length - right;Decreased weight shift to left;Antalgic Gait velocity: Decreased Gait velocity interpretation: Below normal speed for age/gender General Gait Details: Verbal cues for safe use of RW and gait sequence.  Patient walking too close to RW, cues to keep RW slightly ahead of himself for balance.  Stairs            Wheelchair Mobility    Modified Rankin (Stroke Patients Only)       Balance                                             Pertinent Vitals/Pain Pain Assessment: 0-10 Pain Score: 4  Pain Location: Lt knee Pain Descriptors / Indicators: Aching Pain Intervention(s): Monitored during session;Patient requesting pain meds-RN notified;Repositioned    Home Living Family/patient expects to be discharged to:: Private residence Living Arrangements: Children (Teenage daughter at home) Available Help at Discharge: Family;Available PRN/intermittently Type of Home: House Home Access: Stairs to enter Entrance Stairs-Rails: Right Entrance Stairs-Number of Steps: 3 Home Layout: One level Home Equipment: Walker - 2 wheels;Cane - single point      Prior Function Level of Independence: Independent with assistive device(s)         Comments: Using cane pta due to knee pain     Hand Dominance        Extremity/Trunk Assessment   Upper Extremity Assessment: Overall WFL for tasks assessed  Lower Extremity Assessment: LLE deficits/detail   LLE Deficits / Details: Decreased strength and ROM post-op  Cervical / Trunk Assessment: Normal  Communication   Communication: No difficulties  Cognition Arousal/Alertness: Awake/alert Behavior During Therapy: Restless;Impulsive Overall Cognitive Status: Within Functional Limits for tasks assessed (Needs cues for safety (amb in room without KI))                      General Comments       Exercises        Assessment/Plan    PT Assessment Patient needs continued PT services  PT Diagnosis Difficulty walking;Acute pain   PT Problem List Decreased strength;Decreased range of motion;Decreased activity tolerance;Decreased balance;Decreased mobility;Decreased knowledge of use of DME;Decreased safety awareness;Decreased knowledge of precautions;Pain  PT Treatment Interventions DME instruction;Gait training;Stair training;Functional mobility training;Therapeutic activities;Therapeutic exercise;Patient/family education   PT Goals (Current goals can be found in the Care Plan section) Acute Rehab PT Goals Patient Stated Goal: to go home PT Goal Formulation: With patient Time For Goal Achievement: 01/16/14 Potential to Achieve Goals: Good    Frequency 7X/week   Barriers to discharge Decreased caregiver support Does not seem to have 24 hour assist at home.    Co-evaluation               End of Session Equipment Utilized During Treatment: Gait belt;Left knee immobilizer Activity Tolerance: Patient limited by pain;Patient limited by fatigue Patient left: in bed;with call bell/phone within reach Nurse Communication: Mobility status (Decreased safety awareness; Up in room alone without KI)         Time: [id number] PT Time Calculation (min) (ACUTE ONLY): 26 min   Charges:   PT Evaluation $Initial PT Evaluation Tier I: 1 Procedure PT Treatments $Gait Training: 8-22 mins   PT G Codes:          Vena AustriaDavis, Graysin Luczynski H 01/09/2014, 5:51 PM Durenda HurtSusan H. Renaldo Fiddleravis, PT, [facility] Acute Rehab Services Pager [phone]

## 2014-01-09 NOTE — Progress Notes (Signed)
[facility]    Date of Admission:  01/07/2014   Total days of antibiotics 2        Day 2 vanco        Day 2 ceftriaxone         ID: Johnathan Barr is a 53 y.o. male with early left knee pji s/p DAIR Principal Problem:   Infected prosthetic knee joint, left Active Problems:   Infection of prosthetic left knee joint    Subjective: Afebrile, tolerating antibiotics without difficulty. Some knee pain.  Medications:  . buPROPion  150 mg Oral Daily  . cefTRIAXone (ROCEPHIN)  IV  2 g Intravenous Q24H  . Chlorhexidine Gluconate Cloth  6 each Topical Q0600  . clotrimazole   Topical BID  . docusate sodium  100 mg Oral BID  . ferrous sulfate  325 mg Oral BID  . lisinopril  10 mg Oral Daily   And  . hydrochlorothiazide  12.5 mg Oral Daily  . insulin aspart  0-15 Units Subcutaneous TID WC  . metoprolol succinate  50 mg Oral Daily  . mupirocin ointment  1 application Nasal BID  . pantoprazole  20 mg Oral Daily  . rivaroxaban  10 mg Oral Q breakfast  . senna  1 tablet Oral BID  . vancomycin  1,000 mg Intravenous Q12H    Objective: Vital signs in last 24 hours: Temp:  [98.9 F (37.2 C)-100.1 F (37.8 C)] 99.3 F (37.4 C) (11/16 1257) Pulse Rate:  [57-74] 70 (11/16 1257) Resp:  [16] 16 (11/16 1257) BP: (97-130)/(57-79) 130/70 mmHg (11/16 1257) SpO2:  [96 %-98 %] 98 % (11/16 1257)  Physical Exam  Constitutional: He is oriented to person, place, and time. He appears well-developed and well-nourished. No distress.  HENT:  Mouth/Throat: Oropharynx is clear and moist. No oropharyngeal exudate.  Cardiovascular: Normal rate, regular rhythm and normal heart sounds. Exam reveals no gallop and no friction rub.  No murmur heard.  Pulmonary/Chest: Effort normal and breath sounds normal. No respiratory distress. He has no wheezes.  Abdominal: Soft. Bowel sounds are normal. He exhibits no distension. There is no tenderness.  Lymphadenopathy:  He has no cervical  adenopathy.  Neurological: He is alert and oriented to person, place, and time. Moves all extremities Ext: left knee wrapped, still has drain in place  Skin: Skin is warm and dry. No rash noted. No erythema.  Psychiatric: He has a normal mood and affect. His behavior is normal.    Lab Results  Recent Labs  01/07/14 1824 01/09/14 0447  WBC 7.6 7.4  HGB 12.3* 10.7*  HCT 38.3* 32.9*  NA 141 139  K 4.3 5.1  CL 100 100  CO2 27 29  BUN 15 14  CREATININE 0.87 1.19   Liver Panel No results for input(s): PROT, ALBUMIN, AST, ALT, ALKPHOS, BILITOT, BILIDIR, IBILI in the last 72 hours. Sedimentation Rate  Recent Labs  01/07/14 1824  ESRSEDRATE 100*   C-Reactive Protein  Recent Labs  01/07/14 1824  CRP 8.8*    Microbiology: 11/15 culture x 5: pending Studies/Results: Mr Knee Left W Wo Contrast  01/09/2014   CLINICAL DATA:  Joint infection. Left partial knee replacement on 09/16/2013. Left knee irrigation and debridement with extensive synovectomy on 01/08/2014.  EXAM: MRI OF THE LEFT KNEE WITHOUT AND WITH CONTRAST  TECHNIQUE: Multiplanar, multisequence MR imaging was performed both before and after administration of intravenous contrast.  CONTRAST:  20mL MULTIHANCE GADOBENATE DIMEGLUMINE 529 MG/ML IV  SOLN  COMPARISON:  Radiographs dated 01/06/2014 and MRI dated 06/25/2013  FINDINGS: Left partial knee replacement in the medial compartment is in place. Metal artifact obscures detail in the medial compartment.  There is patchy edema in the lateral femoral condyle and lateral tibial plateau with new full-thickness cartilage loss of the central portion of the femoral condyle and of the tibial plateau with secondary peripheral subluxation of the meniscus. No visible meniscal tear.  The area of lucency at the medial aspect of the lateral femoral condyle seen on radiographs is demonstrated on the image 14 of series 17. This is an area of enhancement after contrast administration. However, the  other sequences do not demonstrate an abnormality in and area. There is no bone destruction at that site.  There is a longitudinal tunnel which extends from the lateral aspect of medial femoral condyle proximally beyond the range of the MRI. This is at least 10.6 cm in length. The abnormality extends to the articular surface, best seen on image 13 of series 14. There is enhancement of this tract after contrast administration. This may be the site of insertion of a rod of the femoral jig. There is enhancement of this tract after contrast administration, best seen on image 13 of series 18.  No other pathologic enhancement of bone after contrast administration.  There is diffuse enhancement of the synovium of the knee joint with a small residual joint effusion. A drain is in place in the lateral aspect of the suprapatellar recess. There is enhancement and edema in the soft tissues superficial to quadriceps muscles and tendon. Midline incision is seen anteriorly through the distal quadriceps.  IMPRESSION: 1. There is a small focal area of abnormal enhancement of the medial aspect of the lateral femoral condyle at the site of radiographic abnormality. The appearance does not meet criteria for an active osteomyelitis and may be only due to adjacent synovitis. 2. Longitudinal tract in the distal femur which may be the result of the insertion of a portion of the femoral jig. However, given the known joint infection, I am concerned that this represents osteomyelitis along the tract.   Electronically Signed   By: Geanie CooleyJim  Maxwell M.D.   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 cx are only at 24hr. Await to see if we can narrow regimen at 48-72hr of culture.  Will need 6 wk of IV antibiotics Goal of vanco trough of 15-20  will need home health for weekly labs and picc line dressing change  Will  arrange follow up in ID clinic  [facility] LLCNIDER, [facility]NTH[facility] Cell: [phone] Pager: [phone]  01/09/2014, 8:00 PM

## 2014-01-09 NOTE — Plan of Care (Signed)
Problem: Phase I Progression Outcomes Goal: Pain controlled with appropriate interventions Outcome: Completed/Met Date Met:  01/09/14 Goal: OOB as tolerated unless otherwise ordered Outcome: Completed/Met Date Met:  01/09/14 Goal: Incision/dressings dry and intact Outcome: Completed/Met Date Met:  01/09/14 Goal: Tubes/drains patent Outcome: Completed/Met Date Met:  01/09/14 Goal: Initial discharge plan identified Outcome: Completed/Met Date Met:  01/09/14 Goal: Voiding-avoid urinary catheter unless indicated Outcome: Completed/Met Date Met:  01/09/14 Goal: Vital signs/hemodynamically stable Outcome: Completed/Met Date Met:  01/09/14 Goal: Other Phase I Outcomes/Goals Outcome: Not Applicable Date Met:  21/19/41  Problem: Phase II Progression Outcomes Goal: Pain controlled Outcome: Completed/Met Date Met:  01/09/14 Goal: Progress activity as tolerated unless otherwise ordered Outcome: Completed/Met Date Met:  01/09/14 Goal: Progressing with IS, TCDB Outcome: Completed/Met Date Met:  01/09/14 Goal: Vital signs stable Outcome: Completed/Met Date Met:  01/09/14 Goal: Dressings dry/intact Outcome: Completed/Met Date Met:  01/09/14 Goal: Foley discontinued Outcome: Not Applicable Date Met:  74/08/14 Goal: Discharge plan established Outcome: Completed/Met Date Met:  01/09/14 Goal: Tolerating diet Outcome: Completed/Met Date Met:  01/09/14 Goal: Other Phase II Outcomes/Goals Outcome: Not Applicable Date Met:  48/18/56  Problem: Phase III Progression Outcomes Goal: Pain controlled on oral analgesia Outcome: Completed/Met Date Met:  01/09/14 Goal: Activity at appropriate level-compared to baseline (UP IN CHAIR FOR HEMODIALYSIS)  Outcome: Adequate for Discharge Goal: Voiding independently Outcome: Completed/Met Date Met:  01/09/14 Goal: IV changed to normal saline lock Outcome: Adequate for Discharge Goal: Nasogastric tube discontinued Outcome: Not Applicable Date Met:   31/49/70 Goal: Discharge plan remains appropriate-arrangements made Outcome: Completed/Met Date Met:  01/09/14 Goal: Demonstrates TCDB, IS independently Outcome: Completed/Met Date Met:  01/09/14 Goal: Other Phase III Outcomes/Goals Outcome: Not Applicable Date Met:  26/37/85  Problem: Discharge Progression Outcomes Goal: Barriers To Progression Addressed/Resolved Outcome: Not Applicable Date Met:  88/50/27 Goal: Discharge plan in place and appropriate Outcome: Completed/Met Date Met:  01/09/14 Goal: Pain controlled with appropriate interventions Outcome: Completed/Met Date Met:  01/09/14 Goal: Hemodynamically stable Outcome: Completed/Met Date Met:  74/12/87 Goal: Complications resolved/controlled Outcome: Not Applicable Date Met:  86/76/72 Goal: Tolerating diet Outcome: Completed/Met Date Met:  01/09/14 Goal: Activity appropriate for discharge plan Outcome: Adequate for Discharge

## 2014-01-09 NOTE — Progress Notes (Signed)
0840Peripherally Inserted Central Catheter/Midline Placement  The IV Nurse has discussed with the patient and/or persons authorized to consent for the patient, the purpose of this procedure and the potential benefits and risks involved with this procedure.  The benefits include less needle sticks, lab draws from the catheter and patient may be discharged home with the catheter.  Risks include, but not limited to, infection, bleeding, blood clot (thrombus formation), and puncture of an artery; nerve damage and irregular heat beat.  Alternatives to this procedure were also discussed.  PICC/Midline Placement Documentation  PICC / Midline Single Lumen 01/09/14 PICC Right Basilic 45 cm 2 cm (Active)  Indication for Insertion or Continuance of Line Home intravenous therapies (PICC only) 01/09/2014  9:00 AM  Exposed Catheter (cm) 2 cm 01/09/2014  9:00 AM  Dressing Change Due 01/16/14 01/09/2014  9:00 AM       Stacie GlazeJoyce, Vinicius Brockman Horton 01/09/2014, 9:03 AM

## 2014-01-09 NOTE — Progress Notes (Signed)
Patient ID: Johnathan BaltimoreJohn E Barr, male   DOB: 02/22/1961, 53 y.o.   MRN: [id number]     Subjective:  Patient reports pain as mild to moderate.  Patient very agitated and restless denies any CP or SOB  Objective:   VITALS:   Filed Vitals:   01/08/14 1650 01/08/14 2055 01/09/14 0045 01/09/14 0508  BP: 116/80 100/57 121/79 97/63  Pulse:  72 74 57  Temp:  99.4 F (37.4 C) 100.1 F (37.8 C) 98.9 F (37.2 C)  TempSrc:  Oral Oral Oral  Resp:  16 16 16   Height:      Weight:      SpO2:  96% 96% 97%    ABD soft Sensation intact distally Dorsiflexion/Plantar flexion intact Incision: dressing C/D/I and moderate drainage Hemo-vac drain in place and active Good foot and ankle motion  Lab Results  Component Value Date   WBC 7.4 01/09/2014   HGB 10.7* 01/09/2014   HCT 32.9* 01/09/2014   MCV 92.9 01/09/2014   PLT 267 01/09/2014   BMET    Component Value Date/Time   NA 139 01/09/2014 0447   K 5.1 01/09/2014 0447   CL 100 01/09/2014 0447   CO2 29 01/09/2014 0447   GLUCOSE 125* 01/09/2014 0447   BUN 14 01/09/2014 0447   CREATININE 1.19 01/09/2014 0447   CREATININE 1.00 01/06/2014 1000   CALCIUM 9.1 01/09/2014 0447   GFRNONAA 67* 01/09/2014 0447   GFRAA 78* 01/09/2014 0447     Assessment/Plan: 1 Day Post-Op   Principal Problem:   Infected prosthetic knee joint, left Active Problems:   Infection of prosthetic left knee joint   Advance diet Up with therapy Continue on abx regimen Waiting on MRI of the knee to check for osteomyelitis Pending results of MRI may require more surgery   Haskel KhanDOUGLAS PARRY, BRANDON 01/09/2014, 7:34 AM  Discussed and agree with above.    Teryl LucyJoshua Ferlando Lia, MD Cell [phone]

## 2014-01-10 ENCOUNTER — Encounter (HOSPITAL_COMMUNITY): Payer: Self-pay | Admitting: Orthopedic Surgery

## 2014-01-10 LAB — GLUCOSE, CAPILLARY: Glucose-Capillary: 150 mg/dL — ABNORMAL HIGH (ref 70–99)

## 2014-01-10 LAB — BODY FLUID CULTURE
Gram Stain: NONE SEEN
Organism ID, Bacteria: NO GROWTH

## 2014-01-10 LAB — CBC
HCT: 33.9 % — ABNORMAL LOW (ref 39.0–52.0)
Hemoglobin: 11.1 g/dL — ABNORMAL LOW (ref 13.0–17.0)
MCH: 29.6 pg (ref 26.0–34.0)
MCHC: 32.7 g/dL (ref 30.0–36.0)
MCV: 90.4 fL (ref 78.0–100.0)
Platelets: 281 10*3/uL (ref 150–400)
RBC: 3.75 MIL/uL — AB (ref 4.22–5.81)
RDW: 14.1 % (ref 11.5–15.5)
WBC: 7.4 10*3/uL (ref 4.0–10.5)

## 2014-01-10 LAB — BASIC METABOLIC PANEL
ANION GAP: 12 (ref 5–15)
BUN: 10 mg/dL (ref 6–23)
CHLORIDE: 98 meq/L (ref 96–112)
CO2: 28 meq/L (ref 19–32)
Calcium: 9.5 mg/dL (ref 8.4–10.5)
Creatinine, Ser: 0.9 mg/dL (ref 0.50–1.35)
GFR calc non Af Amer: >90 mL/min (ref >90)
Glucose, Bld: 126 mg/dL — ABNORMAL HIGH (ref 70–99)
Potassium: 4.4 mEq/L (ref 3.7–5.3)
Sodium: 138 mEq/L (ref 137–147)

## 2014-01-10 MED ORDER — MUPIROCIN 2 % EX OINT: 1.0000 "application " | TOPICAL_OINTMENT | Freq: Two times a day (BID) | CUTANEOUS | Status: DC

## 2014-01-10 MED ORDER — RIVAROXABAN 10 MG PO TABS: 10.0000 mg | ORAL_TABLET | Freq: Every day | ORAL | Status: DC

## 2014-01-10 MED ORDER — SENNA-DOCUSATE SODIUM 8.6-50 MG PO TABS
2.0000 | ORAL_TABLET | Freq: Every day | ORAL | Status: DC
Start: 2014-01-10 — End: 2014-09-07

## 2014-01-10 MED ORDER — VANCOMYCIN HCL 1000 MG IV SOLR: INTRAVENOUS | Status: DC

## 2014-01-10 MED ORDER — ONDANSETRON HCL 4 MG PO TABS: 4.0000 mg | ORAL_TABLET | Freq: Three times a day (TID) | ORAL | Status: DC | PRN

## 2014-01-10 MED ORDER — OXYCODONE-ACETAMINOPHEN 10-325 MG PO TABS: 1.0000 | ORAL_TABLET | Freq: Four times a day (QID) | ORAL | Status: DC | PRN

## 2014-01-10 MED ORDER — CYCLOBENZAPRINE HCL 10 MG PO TABS: ORAL_TABLET | ORAL | Status: DC

## 2014-01-10 MED ORDER — CEFTRIAXONE SODIUM 1 G IV SOLR: 1.0000 g | INTRAVENOUS | Status: DC

## 2014-01-10 NOTE — Progress Notes (Signed)
Physical Therapy Treatment Patient Details Name: Johnathan BaltimoreJohn E Barr MRN: [id number] DOB: 09/29/1960 Today's Date: 01/10/2014    History of Present Illness Patient is a 53 yo male admitted 01/07/14 with infection of Lt partial knee replacement.  Patient now s/p I&D and replacement of components.  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 Patient is planning to DC home today and will not require second PT session. Patient safe to D/C from a mobility standpoint based on progression towards goals set on PT eval.    Follow Up Recommendations  Home health PT;Supervision for mobility/OOB     Equipment Recommendations  None recommended by PT    Recommendations for Other Services       Precautions / Restrictions Precautions Precautions: Knee Precaution Comments: Per Patient, PA stated patient did not need to wear brace. No buckling noted Restrictions LLE Weight Bearing: Weight bearing as tolerated    Mobility  Bed Mobility Overal bed mobility: Modified Independent                Transfers Overall transfer level: Modified independent                  Ambulation/Gait Ambulation/Gait assistance: Supervision Ambulation Distance (Feet): 250 Feet   Gait Pattern/deviations: Step-to pattern;Decreased stance time - left;Decreased step length - right Gait velocity: Decreased   General Gait Details: Cues for L knee extension in stance phase   Stairs Stairs: Yes Stairs assistance: Min guard Stair Management: Step to pattern;Forwards;One rail Right;With cane Number of Stairs: 3 General stair comments: Cues to recall sequency.   Wheelchair Mobility    Modified Rankin (Stroke Patients Only)       Balance                                    Cognition Arousal/Alertness: Awake/alert Behavior During Therapy: WFL for tasks  assessed/performed Overall Cognitive Status: Within Functional Limits for tasks assessed                      Exercises Total Joint Exercises Quad Sets: AROM;Left;10 reps Heel Slides: AAROM;Left;10 reps Straight Leg Raises: AAROM;Left;10 reps    General Comments        Pertinent Vitals/Pain Pain Score: 3  Pain Location: Lt knee Pain Descriptors / Indicators: Aching Pain Intervention(s): Monitored during session    Home Living                      Prior Function            PT Goals (current goals can now be found in the care plan section) Progress towards PT goals: Progressing toward goals    Frequency  7X/week    PT Plan Current plan remains appropriate    Co-evaluation             End of Session Equipment Utilized During Treatment: Gait belt Activity Tolerance: Patient tolerated treatment well Patient left: in bed;with call bell/phone within reach     Time: 0813-0837 PT Time Calculation (min) (ACUTE ONLY): 24 min  Charges:  $Gait Training: 8-22 mins $Therapeutic Exercise: 8-22 mins                    G Codes:  Fredrich BirksRobinette, Julia Elizabeth 01/10/2014, 8:46 AM 01/10/2014 Fredrich Birksobinette, Julia Elizabeth PTA [phone] pager [phone] office

## 2014-01-10 NOTE — Progress Notes (Signed)
Patient ID: Patsy BaltimoreJohn E Koren, male   DOB: 01/23/1961, 53 y.o.   MRN: [id number]     Subjective:  Patient reports pain as mild.  He denies any CP or SOB.  Patient states that he fells better now than after the index surgery  Objective:   VITALS:   Filed Vitals:   01/09/14 0508 01/09/14 1257 01/09/14 2034 01/10/14 0536  BP: 97/63 130/70 114/69 109/73  Pulse: 57 70 69 62  Temp: 98.9 F (37.2 C) 99.3 F (37.4 C) 99.7 F (37.6 C) 98.6 F (37 C)  TempSrc: Oral  Oral Oral  Resp: 16 16    Height:      Weight:      SpO2: 97% 98% 98% 99%    ABD soft Sensation intact distally Dorsiflexion/Plantar flexion intact Incision: dressing C/D/I and no drainage Dressing removed and the wound is clean and dry no redness  Good ankle and foot function PICC line in place   Lab Results  Component Value Date   WBC 7.4 01/10/2014   HGB 11.1* 01/10/2014   HCT 33.9* 01/10/2014   MCV 90.4 01/10/2014   PLT 281 01/10/2014   BMET    Component Value Date/Time   NA 138 01/10/2014 0445   K 4.4 01/10/2014 0445   CL 98 01/10/2014 0445   CO2 28 01/10/2014 0445   GLUCOSE 126* 01/10/2014 0445   BUN 10 01/10/2014 0445   CREATININE 0.90 01/10/2014 0445   CREATININE 1.00 01/06/2014 1000   CALCIUM 9.5 01/10/2014 0445   GFRNONAA >90 01/10/2014 0445   GFRAA >90 01/10/2014 0445     Assessment/Plan: 2 Days Post-Op   Principal Problem:   Infected prosthetic knee joint, left Active Problems:   Infection of prosthetic left knee joint   Advance diet Up with therapy Discharge home with home health Continue plan per ID WBAT Continue IV abx Follow up with Dr Dion SaucierLandau in one week   Torrie MayersUGLAS PARRY, Apolinar JunesBRANDON 01/10/2014, 7:20 AM  Seen and agree.  See dc summary.  Teryl LucyJoshua Irfan Veal, MD Cell [phone]

## 2014-01-10 NOTE — Discharge Summary (Signed)
Physician Discharge Summary  Patient ID: Johnathan Barr MRN: [id number] DOB/AGE: 1960/11/22 53 y.o.  Admit date: 01/07/2014 Discharge date: 01/10/2014  Admission Diagnoses:  Infected prosthetic knee joint  Discharge Diagnoses:  Principal Problem:   Infected prosthetic knee joint, left   Past Medical History  Diagnosis Date  . Colon polyps     2  . Osteoarthritis of left knee 09/16/2013  . Diabetes mellitus   . Hypertension   . Hyperlipidemia   . Hyperthyroidism   . Infected prosthetic knee joint, left 01/08/2014    Surgeries: Procedure(s): IRRIGATION AND DEBRIDEMENT EXTREMITY LEFT KNEE LEFT KNEE IRRIGATION & DEBRIDEMENT WITH EXCHANGE OF POLYETHALINE  on 01/07/2014 - 01/08/2014   Consultants (if any): ID Dr[facility] and [facility])  Discharged Condition: Improved  Hospital Course: Johnathan Barr is an 53 y.o. male who was admitted 01/07/2014 with a diagnosis of Infected prosthetic knee joint and went to the operating room on 01/07/2014 - 01/08/2014 and underwent the above named procedures.  He had a preoperative aspiration off of antibiotics done under ultrasound, which has not grown out anything at 3 days. His surgical cultures were sent, although these also have not grown out anything as of 2 days. He did test positive for PCR screen for MRSA, and has a personal history of MRSA contact with a ex-girlfriend who had recurrent infections.  I did get an MRI postoperatively, to evaluate the lateral compartment and the possibility of osteomyelitis, this did not demonstrate osteomyelitis in the lateral compartment definitively. There was some degenerative changes noted, increased from before his unicompartmental knee replacement. There was also the note of a tract going along the canal of the femur, however this tract was made surgically at the time of the index operation, as this was where the guide rod was placed for the femoral cut. Additionally, I did debride this whole during the revision  operation, and therefore, in the absence of clear definitive osteomyelitis, I did not recommend further surgical intervention at the current time.  I discussed this at length with Johnathan Barr, counseling him that a two-stage cement interposition arthroplasty is a more predictable result, however is a longer and more traumatic course, and that if we can get him healed and symptom free with a single stage irrigation and debridement with polyethylene exchange and IV antibiotics, this would be a success. I provided him with the options, and he clearly said he wished to give the I and D and poly-exchange enough of a try to see if it is successful. If not then we will have to proceed with the two-stage uterus position arthroplasty, and we would go back to performing the bony cuts on the lateral side, as well as opening up the femoral canal, and a much more aggressive surgical removal of bone.  In light of these options, as well as in the absence of definitive evidence for osteomyelitis on the MRI, we are going to see whether or not this single stage operation is successful, otherwise we will proceed with revision I and D with bony excision.  Again, his cultures did not come back during the hospital course, and we will discharge on Rocephin and vancomycin for 6 weeks with a PICC line, unless the cultures demonstrate something differently. I think for certainly need to cover him for MRSA given the positive PCR screen.  He was given perioperative antibiotics:  Anti-infectives    Start     Dose/Rate Route Frequency Ordered Stop   01/10/14 0000  cefTRIAXone (ROCEPHIN) 1 G  SOLR injection     1 g Intravenous Every 24 hours 01/10/14 0939     01/10/14 0000  vancomycin (VANCOCIN) 1000 MG injection        01/10/14 0939     01/08/14 2200  vancomycin (VANCOCIN) IVPB 1000 mg/200 mL premix     1,000 mg200 mL/hr over 60 Minutes Intravenous Every 12 hours 01/08/14 1354     01/08/14 2000  cefTRIAXone (ROCEPHIN) 2 g in dextrose 5  % 50 mL IVPB - Premix     2 g100 mL/hr over 30 Minutes Intravenous Every 24 hours 01/08/14 1354     01/08/14 0800  vancomycin (VANCOCIN) IVPB 1000 mg/200 mL premix     1,000 mg200 mL/hr over 60 Minutes Intravenous To Surgery 01/08/14 0746 01/08/14 1000   01/08/14 0800  ceFAZolin (ANCEF) IVPB 2 g/50 mL premix     2 g100 mL/hr over 30 Minutes Intravenous To Surgery 01/08/14 0746 01/08/14 0955   01/08/14 0754  vancomycin (VANCOCIN) 1 GM/200ML IVPB    Comments:  Donette Larry   : cabinet override      01/08/14 0754 01/08/14 1959    .  He was given sequential compression devices, early ambulation, and xarelto for DVT prophylaxis.  He benefited maximally from the hospital stay and there were no complications.    Recent vital signs:  Filed Vitals:   01/10/14 0536  BP: 109/73  Pulse: 62  Temp: 98.6 F (37 C)  Resp:     Recent laboratory studies:  Lab Results  Component Value Date   HGB 11.1* 01/10/2014   HGB 10.7* 01/09/2014   HGB 12.3* 01/07/2014   Lab Results  Component Value Date   WBC 7.4 01/10/2014   PLT 281 01/10/2014   No results found for: INR Lab Results  Component Value Date   NA 138 01/10/2014   K 4.4 01/10/2014   CL 98 01/10/2014   CO2 28 01/10/2014   BUN 10 01/10/2014   CREATININE 0.90 01/10/2014   GLUCOSE 126* 01/10/2014    Discharge Medications:     Medication List    STOP taking these medications        ketorolac 10 MG tablet  Commonly known as:  TORADOL      TAKE these medications        buPROPion 150 MG 24 hr tablet  Commonly known as:  WELLBUTRIN XL  TAKE 1 TABLET (150 MG TOTAL) BY MOUTH DAILY.     cefTRIAXone 1 G Solr injection  Commonly known as:  ROCEPHIN  Inject 1 g into the vein daily.     cyclobenzaprine 10 MG tablet  Commonly known as:  FLEXERIL  One half tab PO qHS, then increase gradually to one tab TID.     ferrous sulfate 325 (65 FE) MG EC tablet  Take 1 tablet (325 mg total) by mouth 3 (three) times daily with meals.      GLUMETZA 1000 MG (MOD) 24 hr tablet  Generic drug:  metFORMIN  TAKE 1 TABLET BY MOUTH DAILY WITH BREAKFAST.     lisinopril-hydrochlorothiazide 10-12.5 MG per tablet  Commonly known as:  PRINZIDE,ZESTORETIC  TAKE 1 TABLET BY MOUTH DAILY.     metoprolol succinate 50 MG 24 hr tablet  Commonly known as:  TOPROL XL  Take 1 tablet (50 mg total) by mouth daily. Take with or immediately following a meal.     mupirocin ointment 2 %  Commonly known as:  BACTROBAN  Place 1 application into the nose  2 (two) times daily.     ondansetron 4 MG tablet  Commonly known as:  ZOFRAN  Take 1 tablet (4 mg total) by mouth every 8 (eight) hours as needed for nausea or vomiting.     oxiconazole 1 % lotion  Commonly known as:  OXISTAT  Apply to affected area daily     oxyCODONE-acetaminophen 10-325 MG per tablet  Commonly known as:  PERCOCET  Take 1-2 tablets by mouth every 6 (six) hours as needed for pain. MAXIMUM TOTAL ACETAMINOPHEN DOSE IS 4000 MG PER DAY     pantoprazole 20 MG tablet  Commonly known as:  PROTONIX  Take 20 mg by mouth daily.     pantoprazole 20 MG tablet  Commonly known as:  PROTONIX  Take 2 tablets (40 mg total) by mouth daily.     rivaroxaban 10 MG Tabs tablet  Commonly known as:  XARELTO  Take 1 tablet (10 mg total) by mouth daily with breakfast.     sennosides-docusate sodium 8.6-50 MG tablet  Commonly known as:  SENOKOT-S  Take 2 tablets by mouth daily.     vancomycin 1000 MG injection  Commonly known as:  VANCOCIN  1000 mg IV q 12 hrs, dosing per pharmacy protocol / hh        Diagnostic Studies: Dg Knee 1-2 Views Right  01/06/2014   CLINICAL DATA:  Partial knee replacement.  EXAM: LEFT KNEE - COMPLETE 4+ VIEW; RIGHT KNEE - 1-2 VIEW  COMPARISON:  06/17/2013.  FINDINGS: Large knee joint effusion. Medial left knee hemiarthroplasty. Bony density noted adjacent to the medial tibial plateau. This could be a fracture fragment from the medial tibial plateau. Bony  lucency is noted along the medial aspect of the lateral femoral condyle. This is a new finding. Focal area of osteomyelitis cannot be excluded.  IMPRESSION: 1. Large knee joint effusion. 2. Lucency noted along the medial aspect of the lateral femoral condyle. This is new from prior study 06/17/2013. A process such as osteomyelitis cannot be excluded. 3. Left knee medial hemiarthroplasty. Tiny bony density is noted along the medial tibial plateau. This could represent a fracture fragment from the medial tibial plateau. Prostheses intact. These results will be called to the ordering clinician or representative by the Radiologist Assistant, and communication documented in the PACS or zVision Dashboard.   Electronically Signed   By: Maisie Fushomas  Register   On: 01/06/2014 13:33   Mr Knee Left W Wo Contrast  01/09/2014   CLINICAL DATA:  Joint infection. Left partial knee replacement on 09/16/2013. Left knee irrigation and debridement with extensive synovectomy on 01/08/2014.  EXAM: MRI OF THE LEFT KNEE WITHOUT AND WITH CONTRAST  TECHNIQUE: Multiplanar, multisequence MR imaging was performed both before and after administration of intravenous contrast.  CONTRAST:  20mL MULTIHANCE GADOBENATE DIMEGLUMINE 529 MG/ML IV SOLN  COMPARISON:  Radiographs dated 01/06/2014 and MRI dated 06/25/2013  FINDINGS: Left partial knee replacement in the medial compartment is in place. Metal artifact obscures detail in the medial compartment.  There is patchy edema in the lateral femoral condyle and lateral tibial plateau with new full-thickness cartilage loss of the central portion of the femoral condyle and of the tibial plateau with secondary peripheral subluxation of the meniscus. No visible meniscal tear.  The area of lucency at the medial aspect of the lateral femoral condyle seen on radiographs is demonstrated on the image 14 of series 17. This is an area of enhancement after contrast administration. However, the other sequences do not  demonstrate an abnormality in and area. There is no bone destruction at that site.  There is a longitudinal tunnel which extends from the lateral aspect of medial femoral condyle proximally beyond the range of the MRI. This is at least 10.6 cm in length. The abnormality extends to the articular surface, best seen on image 13 of series 14. There is enhancement of this tract after contrast administration. This may be the site of insertion of a rod of the femoral jig. There is enhancement of this tract after contrast administration, best seen on image 13 of series 18.  No other pathologic enhancement of bone after contrast administration.  There is diffuse enhancement of the synovium of the knee joint with a small residual joint effusion. A drain is in place in the lateral aspect of the suprapatellar recess. There is enhancement and edema in the soft tissues superficial to quadriceps muscles and tendon. Midline incision is seen anteriorly through the distal quadriceps.  IMPRESSION: 1. There is a small focal area of abnormal enhancement of the medial aspect of the lateral femoral condyle at the site of radiographic abnormality. The appearance does not meet criteria for an active osteomyelitis and may be only due to adjacent synovitis. 2. Longitudinal tract in the distal femur which may be the result of the insertion of a portion of the femoral jig. However, given the known joint infection, I am concerned that this represents osteomyelitis along the tract.   Electronically Signed   By: Geanie CooleyJim  Maxwell M.D.   On: 01/09/2014 13:26   Dg Knee Complete 4 Views Left  01/06/2014   CLINICAL DATA:  Partial knee replacement.  EXAM: LEFT KNEE - COMPLETE 4+ VIEW; RIGHT KNEE - 1-2 VIEW  COMPARISON:  06/17/2013.  FINDINGS: Large knee joint effusion. Medial left knee hemiarthroplasty. Bony density noted adjacent to the medial tibial plateau. This could be a fracture fragment from the medial tibial plateau. Bony lucency is noted along  the medial aspect of the lateral femoral condyle. This is a new finding. Focal area of osteomyelitis cannot be excluded.  IMPRESSION: 1. Large knee joint effusion. 2. Lucency noted along the medial aspect of the lateral femoral condyle. This is new from prior study 06/17/2013. A process such as osteomyelitis cannot be excluded. 3. Left knee medial hemiarthroplasty. Tiny bony density is noted along the medial tibial plateau. This could represent a fracture fragment from the medial tibial plateau. Prostheses intact. These results will be called to the ordering clinician or representative by the Radiologist Assistant, and communication documented in the PACS or zVision Dashboard.   Electronically Signed   By: Maisie Fushomas  Register   On: 01/06/2014 13:33    Disposition: 01-Home or Self Care        Follow-up Information    Follow up with Eulas PostLANDAU,Yoniel Arkwright P, MD. Schedule an appointment as soon as possible for a visit in 1 week.   Specialty:  Orthopedic Surgery   Contact information:   [address] [place] [address] [phone]       Follow up with [facility].   Why:  Someone from [facility] will contact you concerning start date and time for Home Health RN and physical therapy.   Contact information:   [address] [place] [address] [phone]       Follow up with Eulas PostLANDAU,Zoe Creasman P, MD. Schedule an appointment as soon as possible for a visit in 1 week.   Specialty:  Orthopedic Surgery  Contact information:   [address] [place] [address] [phone]        Signed: [name] 01/10/2014, 10:05 AM

## 2014-01-10 NOTE — Progress Notes (Signed)
[facility]    Date of Admission:  01/07/2014   Total days of antibiotics 3        Day 3 vanco        Day 3 ceftriaxone         ID: Johnathan Barr is a 53 y.o. male with early left knee pji s/p DAIR Principal Problem:   Infected prosthetic knee joint, left Active Problems:   Infection of prosthetic left knee joint    Subjective: Afebrile, tolerating antibiotics without difficulty. cx at 48hr still NGTD  Medications:  . buPROPion  150 mg Oral Daily  . cefTRIAXone (ROCEPHIN)  IV  2 g Intravenous Q24H  . Chlorhexidine Gluconate Cloth  6 each Topical Q0600  . clotrimazole   Topical BID  . docusate sodium  100 mg Oral BID  . ferrous sulfate  325 mg Oral BID  . lisinopril  10 mg Oral Daily   And  . hydrochlorothiazide  12.5 mg Oral Daily  . insulin aspart  0-15 Units Subcutaneous TID WC  . metoprolol succinate  50 mg Oral Daily  . mupirocin ointment  1 application Nasal BID  . pantoprazole  20 mg Oral Daily  . rivaroxaban  10 mg Oral Q breakfast  . senna  1 tablet Oral BID  . vancomycin  1,000 mg Intravenous Q12H    Objective: Vital signs in last 24 hours: Temp:  [98.6 F (37 C)-99.7 F (37.6 C)] 98.6 F (37 C) (11/17 0536) Pulse Rate:  [62-70] 62 (11/17 0536) Resp:  [16] 16 (11/16 1257) BP: (109-130)/(69-73) 109/73 mmHg (11/17 0536) SpO2:  [98 %-99 %] 99 % (11/17 0536)  Did not examine   Lab Results  Recent Labs  01/09/14 0447 01/10/14 0445  WBC 7.4 7.4  HGB 10.7* 11.1*  HCT 32.9* 33.9*  NA 139 138  K 5.1 4.4  CL 100 98  CO2 29 28  BUN 14 10  CREATININE 1.19 0.90   Liver Panel No results for input(s): PROT, ALBUMIN, AST, ALT, ALKPHOS, BILITOT, BILIDIR, IBILI in the last 72 hours. Sedimentation Rate  Recent Labs  01/07/14 1824  ESRSEDRATE 100*   C-Reactive Protein  Recent Labs  01/07/14 1824  CRP 8.8*    Microbiology: 11/15 culture x 5: NGTD at 48hr Studies/Results: Mr Knee Left W Wo Contrast  01/09/2014    CLINICAL DATA:  Joint infection. Left partial knee replacement on 09/16/2013. Left knee irrigation and debridement with extensive synovectomy on 01/08/2014.  EXAM: MRI OF THE LEFT KNEE WITHOUT AND WITH CONTRAST  TECHNIQUE: Multiplanar, multisequence MR imaging was performed both before and after administration of intravenous contrast.  CONTRAST:  20mL MULTIHANCE GADOBENATE DIMEGLUMINE 529 MG/ML IV SOLN  COMPARISON:  Radiographs dated 01/06/2014 and MRI dated 06/25/2013  FINDINGS: Left partial knee replacement in the medial compartment is in place. Metal artifact obscures detail in the medial compartment.  There is patchy edema in the lateral femoral condyle and lateral tibial plateau with new full-thickness cartilage loss of the central portion of the femoral condyle and of the tibial plateau with secondary peripheral subluxation of the meniscus. No visible meniscal tear.  The area of lucency at the medial aspect of the lateral femoral condyle seen on radiographs is demonstrated on the image 14 of series 17. This is an area of enhancement after contrast administration. However, the other sequences do not demonstrate an abnormality in and area. There is no bone destruction at that site.  There is a  longitudinal tunnel which extends from the lateral aspect of medial femoral condyle proximally beyond the range of the MRI. This is at least 10.6 cm in length. The abnormality extends to the articular surface, best seen on image 13 of series 14. There is enhancement of this tract after contrast administration. This may be the site of insertion of a rod of the femoral jig. There is enhancement of this tract after contrast administration, best seen on image 13 of series 18.  No other pathologic enhancement of bone after contrast administration.  There is diffuse enhancement of the synovium of the knee joint with a small residual joint effusion. A drain is in place in the lateral aspect of the suprapatellar recess. There is  enhancement and edema in the soft tissues superficial to quadriceps muscles and tendon. Midline incision is seen anteriorly through the distal quadriceps.  IMPRESSION: 1. There is a small focal area of abnormal enhancement of the medial aspect of the lateral femoral condyle at the site of radiographic abnormality. The appearance does not meet criteria for an active osteomyelitis and may be only due to adjacent synovitis. 2. Longitudinal tract in the distal femur which may be the result of the insertion of a portion of the femoral jig. However, given the known joint infection, I am concerned that this represents osteomyelitis along the tract.   Electronically Signed   By: Geanie CooleyJim  Maxwell M.D.   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 cx are all negative at 48hr  Will need 6 wk of IV antibiotics of ceftriaxone 2gm IV daily plus vancomycin Goal of vanco trough of 15-20, will need vanco trough checked before 4th dose today, currently on vanco 1gm Q 12hr  will need home health for weekly labs and picc line dressing change  Will arrange follow up in ID clinic  [facility], [facility] Cell: [phone] Pager: [phone]  01/10/2014, 9:33 AM

## 2014-01-10 NOTE — Evaluation (Signed)
Occupational Therapy Evaluation Patient Details Name: Johnathan Barr MRN: [id number] DOB: 11/02/1960 Today's Date: 01/10/2014    History of Present Illness Patient is a 53 yo male admitted 01/07/14 with infection of Lt partial knee replacement.  Patient now s/p I&D and replacement of components.  PMH:  Lt partial knee replacement 09/16/13, DM, HTN, HLD,   Clinical Impression   Pt admitted with the above diagnoses and presents with below problem list. Pt will benefit from continued acute OT to address the below listed deficits and maximize independence with basic ADLs prior to d/c home. PTA pt was mod I with ADLs using a cane. Pt currently at supervision - min guard level for LB ADLs. ADL education including safety with home setup provided and discussed as detailed below.      Follow Up Recommendations  Supervision - Intermittent;No OT follow up    Equipment Recommendations  Other (comment) (Pt declined 3n1 recommendation.)    Recommendations for Other Services       Precautions / Restrictions Precautions Precautions: Knee Precaution Comments: reviewed Restrictions Weight Bearing Restrictions: Yes LLE Weight Bearing: Weight bearing as tolerated      Mobility Bed Mobility Overal bed mobility: Modified Independent Bed Mobility: Supine to Sit     Supine to sit: Min guard;HOB elevated     General bed mobility comments: Pt used rt leg to advance lt leg across bed and down to floor.   Transfers Overall transfer level: Modified independent Equipment used: Rolling walker (2 wheeled) Transfers: Sit to/from Stand Sit to Stand: Modified independent (Device/Increase time)              Balance Overall balance assessment: Needs assistance         Standing balance support: Bilateral upper extremity supported;During functional activity Standing balance-Leahy Scale: Fair                              ADL Overall ADL's : Needs  assistance/impaired Eating/Feeding: Set up;Sitting   Grooming: Set up;Sitting;Standing   Upper Body Bathing: Set up;Sitting   Lower Body Bathing: Sit to/from stand;Supervison/ safety   Upper Body Dressing : Set up;Sitting   Lower Body Dressing: Sit to/from stand;Supervision/safety   Toilet Transfer: Ambulation;Comfort height toilet;Grab bars;RW;Supervision/safety Toilet Transfer Details (indicate cue type and reason): pt declining 3n1, practiced transfer from comfort height and using grab bars, Pt reports he has a wider rim toilet seat and will continue to use that and sink counter on left side for toilet transfers. Pt struggled somewhat with this technique bu was ultimately successful with extra time needed.  Toileting- ArchitectClothing Manipulation and Hygiene: Sit to/from stand;Supervision/safety   Retail buyerTub/ Shower Transfer: Ambulation;Shower seat;Rolling walker;Min guard   Functional mobility during ADLs: Min guard;Rolling walker General ADL Comments: ADL education provided. Pt has built-in shower seat he plans to use for showers. Discussed height of shower seat likely being a lower surface and absence of grab bars. OT discussed safety benefits of using a 3n1 in shower (and toilet) and offered to recommend with pt ultimately decling 3n1 recommendation. Pt plans to sponge bathe until he feels he can complete shower transfer with his current setup. Practiced toilet transfer simulated to home setup as detailed above.     Vision                     Perception     Praxis      Pertinent Vitals/Pain Pain Assessment: 0-10  Pain Score: 3  Pain Location: Lt knee/quad Pain Descriptors / Indicators: Sore Pain Intervention(s): Monitored during session     Hand Dominance     Extremity/Trunk Assessment Upper Extremity Assessment Upper Extremity Assessment: Overall WFL for tasks assessed   Lower Extremity Assessment Lower Extremity Assessment: Defer to PT evaluation       Communication  Communication Communication: No difficulties   Cognition Arousal/Alertness: Awake/alert Behavior During Therapy: WFL for tasks assessed/performed Overall Cognitive Status: Within Functional Limits for tasks assessed                     General Comments       Exercises       Shoulder Instructions      Home Living Family/patient expects to be discharged to:: Private residence Living Arrangements: Children Available Help at Discharge: Family;Available PRN/intermittently Type of Home: House Home Access: Stairs to enter Entergy CorporationEntrance Stairs-Number of Steps: 3 Entrance Stairs-Rails: Right Home Layout: One level     Bathroom Shower/Tub: Producer, television/film/videoWalk-in shower   Bathroom Toilet: Standard     Home Equipment: Environmental consultantWalker - 2 wheels;Cane - single point          Prior Functioning/Environment Level of Independence: Independent with assistive device(s)        Comments: Using cane pta due to knee pain    OT Diagnosis: Acute pain   OT Problem List: Impaired balance (sitting and/or standing);Decreased knowledge of use of DME or AE;Decreased knowledge of precautions;Pain   OT Treatment/Interventions: Self-care/ADL training;DME and/or AE instruction;Therapeutic activities;Patient/family education;Balance training    OT Goals(Current goals can be found in the care plan section) Acute Rehab OT Goals Patient Stated Goal: to go home OT Goal Formulation: With patient Time For Goal Achievement: 01/17/14 Potential to Achieve Goals: Good ADL Goals Pt Will Transfer to Toilet: with modified independence;ambulating;regular height toilet;grab bars Pt Will Perform Tub/Shower Transfer: with modified independence;ambulating;shower seat;rolling walker  OT Frequency: Min 2X/week   Barriers to D/C:            Co-evaluation              End of Session Equipment Utilized During Treatment: Gait belt;Rolling walker  Activity Tolerance: Patient tolerated treatment well Patient left: in  chair;with call bell/phone within reach   Time: 1037-1056 OT Time Calculation (min): 19 min Charges:  OT General Charges $OT Visit: 1 Procedure OT Evaluation $Initial OT Evaluation Tier I: 1 Procedure OT Treatments $Self Care/Home Management : 8-22 mins G-Codes:    Pilar GrammesMathews, Lamyia Cdebaca H 01/10/2014, 11:16 AM

## 2014-01-11 ENCOUNTER — Encounter: Payer: Managed Care, Other (non HMO) | Admitting: Physical Therapy

## 2014-01-11 ENCOUNTER — Other Ambulatory Visit: Payer: Self-pay | Admitting: *Deleted

## 2014-01-11 DIAGNOSIS — I77810 Thoracic aortic ectasia: Secondary | ICD-10-CM

## 2014-01-12 ENCOUNTER — Ambulatory Visit (INDEPENDENT_AMBULATORY_CARE_PROVIDER_SITE_OTHER): Payer: Managed Care, Other (non HMO)

## 2014-01-12 ENCOUNTER — Other Ambulatory Visit: Payer: Managed Care, Other (non HMO)

## 2014-01-12 DIAGNOSIS — I77810 Thoracic aortic ectasia: Secondary | ICD-10-CM

## 2014-01-12 LAB — TISSUE CULTURE
CULTURE: NO GROWTH
CULTURE: NO GROWTH
Culture: NO GROWTH
Culture: NO GROWTH
Culture: NO GROWTH

## 2014-01-12 MED ORDER — IOHEXOL 350 MG/ML SOLN
125.0000 mL | Freq: Once | INTRAVENOUS | Status: AC | PRN
  Administered 2014-01-12: 125 mL via INTRAVENOUS

## 2014-01-13 ENCOUNTER — Ambulatory Visit: Payer: Managed Care, Other (non HMO) | Admitting: Sports Medicine

## 2014-01-13 ENCOUNTER — Encounter: Payer: Managed Care, Other (non HMO) | Admitting: Physical Therapy

## 2014-01-19 MED ORDER — DAPTOMYCIN 500 MG IV SOLR: 625.0000 mg | Freq: Every day | INTRAVENOUS | Status: DC

## 2014-01-19 NOTE — Telephone Encounter (Signed)
I was called [facility]. His vancomycin dose was increased from 1000 mg to 1500 mg twice daily yesterday. During this morning's infusion he developed onset of intense pruritus, hiatus and swelling such that his eyes were swollen shut. He had no difficulty breathing. He did complete his infusion. He has not had any problems during his infusions of ceftriaxone.he is now been on vancomycin for over 10 days which makes it unlikely that he has simply having red man syndrome. I suspect that this is a true allergy and switched him from vancomycin to daptomycin 6 mg per kilogram daily. I will continue ceftriaxone for now.

## 2014-01-26 ENCOUNTER — Encounter (HOSPITAL_COMMUNITY): Payer: Self-pay | Admitting: Orthopedic Surgery

## 2014-02-06 ENCOUNTER — Encounter (HOSPITAL_COMMUNITY): Payer: Self-pay | Admitting: Orthopedic Surgery

## 2014-02-09 ENCOUNTER — Encounter: Payer: Self-pay | Admitting: Internal Medicine

## 2014-02-09 ENCOUNTER — Ambulatory Visit (INDEPENDENT_AMBULATORY_CARE_PROVIDER_SITE_OTHER): Payer: Managed Care, Other (non HMO) | Admitting: Internal Medicine

## 2014-02-09 DIAGNOSIS — T8450XD Infection and inflammatory reaction due to unspecified internal joint prosthesis, subsequent encounter: Secondary | ICD-10-CM

## 2014-02-09 DIAGNOSIS — T8459XD Infection and inflammatory reaction due to other internal joint prosthesis, subsequent encounter: Secondary | ICD-10-CM

## 2014-02-09 DIAGNOSIS — Z96659 Presence of unspecified artificial knee joint: Principal | ICD-10-CM

## 2014-02-09 MED ORDER — DOXYCYCLINE HYCLATE 100 MG PO TABS: 100.0000 mg | ORAL_TABLET | Freq: Two times a day (BID) | ORAL | Status: DC

## 2014-02-09 MED ORDER — CIPROFLOXACIN HCL 750 MG PO TABS: 750.0000 mg | ORAL_TABLET | Freq: Two times a day (BID) | ORAL | Status: DC

## 2014-02-09 NOTE — Progress Notes (Signed)
RN received verbal order to discontinue the patient's PICC line.  Patient identified with name and date of birth. PICC dressing removed, site unremarkable.    PICC line removed using sterile procedure @ 1645. PICC length equal to that noted in patient's hospital chart of 45 cm. Sterile petroleum gauze + sterile 4X4 applied to PICC site, pressure applied for 10 minutes and covered with Medipore tape as a pressure dressing. Patient tolerated procedure without complaints.  Patient instructed to limit use of arm for 1 hour. Patient instructed that the pressure dressing should remain in place for 24 hours. Patient verbalized understanding of these instructions.

## 2014-02-09 NOTE — Progress Notes (Signed)
Patient ID: Johnathan Barr E Barr, male   DOB: 11/01/1960, 53 y.o.   MRN: [id number]         [facility]  Patient Active Problem List   Diagnosis Date Noted  . Infected prosthetic knee joint, left 01/08/2014    Priority: High  . Insomnia 12/09/2013  . PVC (premature ventricular contraction) 11/04/2013  . Status post left partial knee replacement 09/16/2013  . Left knee pain 06/17/2013  . Obesity 06/17/2013  . Diabetes mellitus, type 2 04/26/2013  . Rotator cuff syndrome of right shoulder 03/22/2013  . Prepatellar bursitis of left knee 03/22/2013  . Left lumbar radiculitis 05/14/2012  . Hyperlipidemia with target LDL less than 100 05/14/2012  . Onychomycosis 11/18/2011  . Peroneal tendinitis 11/18/2011  . Preventive measure 11/18/2011  . Internal hemorrhoids 08/29/2011  . GERD (gastroesophageal reflux disease) 08/29/2011  . Hypertension 08/29/2011  . Lactose intolerance 08/29/2011    Patient's Medications  New Prescriptions   CIPROFLOXACIN (CIPRO) 750 MG TABLET    Take 1 tablet (750 mg total) by mouth 2 (two) times daily.   DOXYCYCLINE (VIBRA-TABS) 100 MG TABLET    Take 1 tablet (100 mg total) by mouth 2 (two) times daily.  Previous Medications   BUPROPION (WELLBUTRIN XL) 150 MG 24 HR TABLET    TAKE 1 TABLET (150 MG TOTAL) BY MOUTH DAILY.   CYCLOBENZAPRINE (FLEXERIL) 10 MG TABLET    One half tab PO qHS, then increase gradually to one tab TID.   FERROUS SULFATE 325 (65 FE) MG EC TABLET    Take 1 tablet (325 mg total) by mouth 3 (three) times daily with meals.   GLUMETZA 1000 MG 24 HR TABLET    TAKE 1 TABLET BY MOUTH DAILY WITH BREAKFAST.   LISINOPRIL-HYDROCHLOROTHIAZIDE (PRINZIDE,ZESTORETIC) 10-12.5 MG PER TABLET    TAKE 1 TABLET BY MOUTH DAILY.   METOPROLOL SUCCINATE (TOPROL XL) 50 MG 24 HR TABLET    Take 1 tablet (50 mg total) by mouth daily. Take with or immediately following a meal.   MUPIROCIN OINTMENT (BACTROBAN) 2 %    Place 1 application into the nose  2 (two) times daily.   ONDANSETRON (ZOFRAN) 4 MG TABLET    Take 1 tablet (4 mg total) by mouth every 8 (eight) hours as needed for nausea or vomiting.   OXICONAZOLE (OXISTAT) 1 % LOTION    Apply to affected area daily   OXYCODONE-ACETAMINOPHEN (PERCOCET) 10-325 MG PER TABLET    Take 1-2 tablets by mouth every 6 (six) hours as needed for pain. MAXIMUM TOTAL ACETAMINOPHEN DOSE IS 4000 MG PER DAY   PANTOPRAZOLE (PROTONIX) 20 MG TABLET    Take 2 tablets (40 mg total) by mouth daily.   PANTOPRAZOLE (PROTONIX) 20 MG TABLET    Take 20 mg by mouth daily.   RIVAROXABAN (XARELTO) 10 MG TABS TABLET    Take 1 tablet (10 mg total) by mouth daily with breakfast.   SENNOSIDES-DOCUSATE SODIUM (SENOKOT-S) 8.6-50 MG TABLET    Take 2 tablets by mouth daily.  Modified Medications   No medications on file  Discontinued Medications   CEFTRIAXONE (ROCEPHIN) 1 G SOLR INJECTION    Inject 1 g into the vein daily.   DAPTOMYCIN (CUBICIN) 500 MG INJECTION    Inject 12.5 mLs (625 mg total) into the vein daily.    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 He had some persistent stiffness and problems with wound  healing. He was eventually rehospitalized in November and underwent incision and drainage. The operative note indicates that "pus" was encountered. He denies being on any antibiotics prior to surgery. Operative Gram stain showed no organisms and cultures were negative. The joint was washed out and he had poly-exchange. My partners, Johnathan Arts administratorComer and Johnathan MunsonCynthia Barr saw him while he was hospitalized. He was discharged on IV vancomycin and ceftriaxone. 12 days into therapy, right after his vancomycin dose increased, he developed diffuse hives and facial swelling. I switched the vancomycin to daptomycin. His reaction resolved promptly within 24 hours. He's had no further problems tolerating his antibiotics and has now completed 33 days of total therapy. He still  has some stiffness in his knee but is feeling better. He is progressing with physical therapy. He has not had any fever. He did remove the extension on his PICC line 2 days ago and did not realize that his PICC line was actually open to the air. He also has a small kink in the line. He has been afraid of the possibility of infection so did not take his doses this morning.  Review of Systems: Pertinent items are noted in HPI.  Past Medical History  Diagnosis Date  . Colon polyps     2  . Osteoarthritis of left knee 09/16/2013  . Diabetes mellitus   . Hypertension   . Hyperlipidemia   . Hyperthyroidism   . Infected prosthetic knee joint, left 01/08/2014    History  Substance Use Topics  . Smoking status: Former Smoker -- 0.25 packs/day    Quit date: 09/12/2012  . Smokeless tobacco: Never Used  . Alcohol Use: 0.0 oz/week    0 Not specified per week     Comment: almost daily    Family History  Problem Relation Age of Onset  . Ovarian cancer Mother   . Heart disease Father     CAD  . Lung cancer Paternal Uncle   . Lymphoma Cousin     Allergies  Allergen Reactions  . Vancomycin Hives    Objective: Temp: 98.6 F (37 C) (12/17 1600) Temp Source: Oral (12/17 1600) BP: 146/96 mmHg (12/17 1600) Pulse Rate: 69 (12/17 1600)  General: He is in good spirits Skin: Right arm PICC site appears normal but the line is uncapped Lungs: Clear Cor: Regular S1 and S2 with no murmur Left knee: Incision healing well   Operative Gram stain and culture of left prosthetic knee 01/08/2014: Both negative  SED RATE (mm/hr)  Date Value  01/07/2014 100*  01/06/2014 109*   CRP (mg/dL)  Date Value  16/10/960411/14/2015 8.8*      Assessment: He has culture-negative prosthetic joint infection. I would normally extend his IV therapy for a full 42 days but given the problems with his PICC line I think it is best to have it removed and go ahead and convert him over to an oral regimen of ciprofloxacin  and doxycycline. Since staph aureus was not isolated I will not add rifampin.  Plan: 1. Discontinue daptomycin and ceftriaxone 2. Remove PICC 3. Start ciprofloxacin 750 mg twice daily along with doxycycline 100 mg daily 4. Follow-up in 6 weeks   Cliffton AstersJohn Adetokunbo Mccadden, MD [facility] [phone] pager   [phone] cell 02/09/2014, 5:23 PM

## 2014-02-27 ENCOUNTER — Inpatient Hospital Stay: Payer: Managed Care, Other (non HMO) | Admitting: Internal Medicine

## 2014-03-07 ENCOUNTER — Other Ambulatory Visit: Payer: Self-pay | Admitting: Sports Medicine

## 2014-03-07 ENCOUNTER — Telehealth: Payer: Self-pay | Admitting: *Deleted

## 2014-03-07 NOTE — Telephone Encounter (Signed)
If he would like to try it we do have some combinations that we could get to him for free with a discount coupon like farxiga and xigduo, would he be okay with his?

## 2014-03-07 NOTE — Telephone Encounter (Signed)
[facility] called that patient was requesting a cheaper medication in place of his glumetza which was currently going to cost him $3700 out of pocket. Please advise.

## 2014-03-09 ENCOUNTER — Encounter: Payer: Self-pay | Admitting: Sports Medicine

## 2014-03-09 ENCOUNTER — Ambulatory Visit (INDEPENDENT_AMBULATORY_CARE_PROVIDER_SITE_OTHER): Payer: Managed Care, Other (non HMO) | Admitting: Sports Medicine

## 2014-03-09 DIAGNOSIS — E119 Type 2 diabetes mellitus without complications: Secondary | ICD-10-CM

## 2014-03-09 MED ORDER — DAPAGLIFLOZIN PRO-METFORMIN ER 5-1000 MG PO TB24: 1.0000 | ORAL_TABLET | Freq: Every day | ORAL | Status: DC

## 2014-03-09 NOTE — Progress Notes (Signed)
  Subjective:    CC: Follow-up  HPI: Septic total knee arthroplasty: Currently doing very well after polyethylene exchange, and IV antibiotics at home. He does have follow-up soon with his surgeon.  Diabetes mellitus type 2: Unfortunately insurance for some reason is not going to pay for his metformin.  Past medical history, Surgical history, Family history not pertinant except as noted below, Social history, Allergies, and medications have been entered into the medical record, reviewed, and no changes needed.   Review of Systems: No fevers, chills, night sweats, weight loss, chest pain, or shortness of breath.   Objective:    General: Well Developed, well nourished, and in no acute distress.  Neuro: Alert and oriented x3, extra-ocular muscles intact, sensation grossly intact.  HEENT: Normocephalic, atraumatic, pupils equal round reactive to light, neck supple, no masses, no lymphadenopathy, thyroid nonpalpable.  Skin: Warm and dry, no rashes. Cardiac: Regular rate and rhythm, no murmurs rubs or gallops, no lower extremity edema.  Respiratory: Clear to auscultation bilaterally. Not using accessory muscles, speaking in full sentences.  Impression and Recommendations:    I spent 25 minutes with this patient, greater than 50% was face-to-face time counseling regarding the above diagnoses

## 2014-03-09 NOTE — Assessment & Plan Note (Signed)
Insurance will no longer pay for his metformin for some reason. Switching to XigDuo. Return in 3 months.

## 2014-03-23 ENCOUNTER — Encounter: Payer: Self-pay | Admitting: Internal Medicine

## 2014-03-23 ENCOUNTER — Ambulatory Visit (INDEPENDENT_AMBULATORY_CARE_PROVIDER_SITE_OTHER): Payer: Managed Care, Other (non HMO) | Admitting: Internal Medicine

## 2014-03-23 DIAGNOSIS — T8459XD Infection and inflammatory reaction due to other internal joint prosthesis, subsequent encounter: Secondary | ICD-10-CM

## 2014-03-23 DIAGNOSIS — Z96659 Presence of unspecified artificial knee joint: Principal | ICD-10-CM

## 2014-03-23 DIAGNOSIS — T8450XD Infection and inflammatory reaction due to unspecified internal joint prosthesis, subsequent encounter: Secondary | ICD-10-CM

## 2014-03-23 LAB — C-REACTIVE PROTEIN: CRP: <0.5 mg/dL

## 2014-03-23 NOTE — Progress Notes (Signed)
Patient ID: Johnathan Barr, male   DOB: 1960-10-07, 54 y.o.   MRN: [id number]         [facility] for Infectious Disease  Patient Active Problem List   Diagnosis Date Noted  . Infected prosthetic knee joint, left 01/08/2014    Priority: High  . Insomnia 12/09/2013  . PVC (premature ventricular contraction) 11/04/2013  . Status post left partial knee replacement 09/16/2013  . Left knee pain 06/17/2013  . Obesity 06/17/2013  . Diabetes mellitus, type 2 04/26/2013  . Rotator cuff syndrome of right shoulder 03/22/2013  . Prepatellar bursitis of left knee 03/22/2013  . Left lumbar radiculitis 05/14/2012  . Hyperlipidemia with target LDL less than 100 05/14/2012  . Onychomycosis 11/18/2011  . Peroneal tendinitis 11/18/2011  . Preventive measure 11/18/2011  . Internal hemorrhoids 08/29/2011  . GERD (gastroesophageal reflux disease) 08/29/2011  . Hypertension 08/29/2011  . Lactose intolerance 08/29/2011    Patient's Medications  New Prescriptions   No medications on file  Previous Medications   BUPROPION (WELLBUTRIN XL) 150 MG 24 HR TABLET    TAKE 1 TABLET (150 MG TOTAL) BY MOUTH DAILY.   CIPROFLOXACIN (CIPRO) 750 MG TABLET    Take 1 tablet (750 mg total) by mouth 2 (two) times daily.   CYCLOBENZAPRINE (FLEXERIL) 10 MG TABLET    One half tab PO qHS, then increase gradually to one tab TID.   DAPAGLIFLOZIN-METFORMIN HCL ER (XIGDUO XR) 06-998 MG TB24    Take 1 tablet by mouth daily.   DOXYCYCLINE (VIBRA-TABS) 100 MG TABLET    Take 1 tablet (100 mg total) by mouth 2 (two) times daily.   FERROUS SULFATE 325 (65 FE) MG EC TABLET    Take 1 tablet (325 mg total) by mouth 3 (three) times daily with meals.   LISINOPRIL-HYDROCHLOROTHIAZIDE (PRINZIDE,ZESTORETIC) 10-12.5 MG PER TABLET    TAKE 1 TABLET BY MOUTH DAILY.   METOPROLOL SUCCINATE (TOPROL-XL) 50 MG 24 HR TABLET    Take 1 tablet by mouth daily - with or immediately follow a meal - OFFICE APPOINTMENT NEEDED FOR REVILL   MUPIROCIN  OINTMENT (BACTROBAN) 2 %    Place 1 application into the nose 2 (two) times daily.   ONDANSETRON (ZOFRAN) 4 MG TABLET    Take 1 tablet (4 mg total) by mouth every 8 (eight) hours as needed for nausea or vomiting.   OXICONAZOLE (OXISTAT) 1 % LOTION    Apply to affected area daily   OXYCODONE-ACETAMINOPHEN (PERCOCET) 10-325 MG PER TABLET    Take 1-2 tablets by mouth every 6 (six) hours as needed for pain. MAXIMUM TOTAL ACETAMINOPHEN DOSE IS 4000 MG PER DAY   PANTOPRAZOLE (PROTONIX) 20 MG TABLET    Take 2 tablets (40 mg total) by mouth daily.   RIVAROXABAN (XARELTO) 10 MG TABS TABLET    Take 1 tablet (10 mg total) by mouth daily with breakfast.   SENNOSIDES-DOCUSATE SODIUM (SENOKOT-S) 8.6-50 MG TABLET    Take 2 tablets by mouth daily.  Modified Medications   No medications on file  Discontinued Medications   PANTOPRAZOLE (PROTONIX) 20 MG TABLET    Take 20 mg by mouth daily.   PANTOPRAZOLE (PROTONIX) 20 MG TABLET    TAKE 2 TABLETS BY MOUTH ONCE A DAY    Subjective: Johnathan Barr is in for his routine follow-up visit for his culture-negative left prosthetic knee infection. He is now completed a total of 75 days of antibiotic therapy. He is currently taking ciprofloxacin and doxycycline. He has some  mild stomach upset on some days but no vomiting and no diarrhea. He feels like he is making progress with his physical therapy. He still has some slight restriction on flexion but otherwise is doing well. He is able to control his pain with 2 Aleve daily. Review of Systems: Pertinent items are noted in HPI.  Past Medical History  Diagnosis Date  . Colon polyps     2  . Osteoarthritis of left knee 09/16/2013  . Diabetes mellitus   . Hypertension   . Hyperlipidemia   . Hyperthyroidism   . Infected prosthetic knee joint, left 01/08/2014    History  Substance Use Topics  . Smoking status: Former Smoker -- 0.25 packs/day    Quit date: 09/12/2012  . Smokeless tobacco: Never Used  . Alcohol Use: 0.0  oz/week    0 Not specified per week     Comment: almost daily    Family History  Problem Relation Age of Onset  . Ovarian cancer Mother   . Heart disease Father     CAD  . Lung cancer Paternal Uncle   . Lymphoma Cousin     Allergies  Allergen Reactions  . Vancomycin Hives    Objective: Temp: 98.4 F (36.9 C) (01/28 1131) Temp Source: Oral (01/28 1131) BP: 108/72 mmHg (01/28 1131) Pulse Rate: 67 (01/28 1131)  General: He is in good spirits and in no distress Left knee: Healed incision. Slight swelling without unusual warmth or redness. He has good range of motion  SED RATE (mm/hr)  Date Value  01/07/2014 100*  01/06/2014 109*   CRP (mg/dL)  Date Value  40/98/119111/14/2015 8.8*     Assessment: He is improving clinically on therapy for culture-negative left prosthetic knee infection. I will continue ciprofloxacin and doxycycline and repeat inflammatory markers today.  Plan: 1. Continue ciprofloxacin and doxycycline 2. Repeat sedimentation rate and C-reactive protein 3. Follow-up in 6 weeks   Johnathan AstersJohn Dardan Shelton, MD [facility] [phone] pager   [phone] cell 03/23/2014, 11:53 AM

## 2014-03-24 LAB — SEDIMENTATION RATE: Sed Rate: 1 mm/hr (ref 0–16)

## 2014-04-13 ENCOUNTER — Other Ambulatory Visit: Payer: Self-pay | Admitting: Sports Medicine

## 2014-05-02 ENCOUNTER — Encounter: Payer: Self-pay | Admitting: Internal Medicine

## 2014-05-02 ENCOUNTER — Ambulatory Visit (INDEPENDENT_AMBULATORY_CARE_PROVIDER_SITE_OTHER): Payer: Managed Care, Other (non HMO) | Admitting: Internal Medicine

## 2014-05-02 VITALS — BP 118/78 | HR 82 | Temp 97.6°F | Wt 253.5 lb

## 2014-05-02 DIAGNOSIS — T8450XD Infection and inflammatory reaction due to unspecified internal joint prosthesis, subsequent encounter: Secondary | ICD-10-CM

## 2014-05-02 DIAGNOSIS — Z96659 Presence of unspecified artificial knee joint: Principal | ICD-10-CM

## 2014-05-02 DIAGNOSIS — T8459XD Infection and inflammatory reaction due to other internal joint prosthesis, subsequent encounter: Secondary | ICD-10-CM

## 2014-05-02 NOTE — Progress Notes (Signed)
Patient ID: Patsy BaltimoreJohn E Kitamura, male   DOB: 04/07/1960, 54 y.o.   MRN: [id number]         [facility]  Patient Active Problem List   Diagnosis Date Noted  . Infected prosthetic knee joint, left 01/08/2014    Priority: High  . Insomnia 12/09/2013  . PVC (premature ventricular contraction) 11/04/2013  . Status post left partial knee replacement 09/16/2013  . Left knee pain 06/17/2013  . Obesity 06/17/2013  . Diabetes mellitus, type 2 04/26/2013  . Rotator cuff syndrome of right shoulder 03/22/2013  . Prepatellar bursitis of left knee 03/22/2013  . Left lumbar radiculitis 05/14/2012  . Hyperlipidemia with target LDL less than 100 05/14/2012  . Onychomycosis 11/18/2011  . Peroneal tendinitis 11/18/2011  . Preventive measure 11/18/2011  . Internal hemorrhoids 08/29/2011  . GERD (gastroesophageal reflux disease) 08/29/2011  . Hypertension 08/29/2011  . Lactose intolerance 08/29/2011    Patient's Medications  New Prescriptions   No medications on file  Previous Medications   BUPROPION (WELLBUTRIN XL) 150 MG 24 HR TABLET    TAKE 1 TABLET (150 MG TOTAL) BY MOUTH DAILY.   CIPROFLOXACIN (CIPRO) 750 MG TABLET    Take 1 tablet (750 mg total) by mouth 2 (two) times daily.   CYCLOBENZAPRINE (FLEXERIL) 10 MG TABLET    One half tab PO qHS, then increase gradually to one tab TID.   DAPAGLIFLOZIN-METFORMIN HCL ER (XIGDUO XR) 06-998 MG TB24    Take 1 tablet by mouth daily.   DOXYCYCLINE (VIBRA-TABS) 100 MG TABLET    Take 1 tablet (100 mg total) by mouth 2 (two) times daily.   FERROUS SULFATE 325 (65 FE) MG EC TABLET    Take 1 tablet (325 mg total) by mouth 3 (three) times daily with meals.   LISINOPRIL-HYDROCHLOROTHIAZIDE (PRINZIDE,ZESTORETIC) 10-12.5 MG PER TABLET    TAKE 1 TABLET BY MOUTH DAILY.   METOPROLOL SUCCINATE (TOPROL-XL) 50 MG 24 HR TABLET    TAKE 1 TABLET BY MOUTH DAILY WITH OR IMMEDIATELYFOLLOW A MEAL   MUPIROCIN OINTMENT (BACTROBAN) 2 %    Place 1 application  into the nose 2 (two) times daily.   ONDANSETRON (ZOFRAN) 4 MG TABLET    Take 1 tablet (4 mg total) by mouth every 8 (eight) hours as needed for nausea or vomiting.   OXICONAZOLE (OXISTAT) 1 % LOTION    Apply to affected area daily   OXYCODONE-ACETAMINOPHEN (PERCOCET) 10-325 MG PER TABLET    Take 1-2 tablets by mouth every 6 (six) hours as needed for pain. MAXIMUM TOTAL ACETAMINOPHEN DOSE IS 4000 MG PER DAY   PANTOPRAZOLE (PROTONIX) 20 MG TABLET    Take 2 tablets (40 mg total) by mouth daily.   RIVAROXABAN (XARELTO) 10 MG TABS TABLET    Take 1 tablet (10 mg total) by mouth daily with breakfast.   SENNOSIDES-DOCUSATE SODIUM (SENOKOT-S) 8.6-50 MG TABLET    Take 2 tablets by mouth daily.  Modified Medications   No medications on file  Discontinued Medications   No medications on file    Subjective: Mr. Clyde CanterburyMorrill is in for his routine follow-up visit. He is now completed 115 days of ciprofloxacin and doxycycline for his culture-negative left prosthetic knee infection. He is feeling much better and tolerating his antibiotics well. He has minimal pain in his knee and requires only one dose of acetaminophen in the morning. He is back working full-time at [employer].  Review of Systems: Pertinent items are noted in HPI.  Past Medical  History  Diagnosis Date  . Colon polyps     2  . Osteoarthritis of left knee 09/16/2013  . Diabetes mellitus   . Hypertension   . Hyperlipidemia   . Hyperthyroidism   . Infected prosthetic knee joint, left 01/08/2014    History  Substance Use Topics  . Smoking status: Former Smoker -- 0.25 packs/day    Quit date: 09/12/2012  . Smokeless tobacco: Never Used  . Alcohol Use: 0.0 oz/week    0 Standard drinks or equivalent per week     Comment: almost daily    Family History  Problem Relation Age of Onset  . Ovarian cancer Mother   . Heart disease Father     CAD  . Lung cancer Paternal Uncle   . Lymphoma Cousin     Allergies  Allergen Reactions  .  Vancomycin Hives    Objective: Temp: 97.6 F (36.4 C) (03/08 1556) Temp Source: Oral (03/08 1556) BP: 118/78 mmHg (03/08 1556) Pulse Rate: 82 (03/08 1556)  General: He is smiling and in good spirits Left knee: He has minimal swelling with no warmth, redness or pain. He has good range of motion  Lab Results SED RATE (mm/hr)  Date Value  03/23/2014 1  01/07/2014 100*  01/06/2014 109*   CRP (mg/dL)  Date Value  78/29/5621 <0.5  01/07/2014 8.8*     Assessment: He has now had roughly 4 months of antibody therapy after I&D and poly change of his left prosthetic knee. He is improving clinically and his inflammatory markers have returned to normal.  Plan: 1. Continue ciprofloxacin and doxycycline for 2 more months 2. Follow-up in 2 months   Cliffton Asters, MD [facility] [phone] pager   [phone] cell 05/02/2014, 4:34 PM

## 2014-05-04 ENCOUNTER — Ambulatory Visit: Payer: Managed Care, Other (non HMO) | Admitting: Internal Medicine

## 2014-06-08 ENCOUNTER — Encounter: Payer: Self-pay | Admitting: Sports Medicine

## 2014-06-08 ENCOUNTER — Ambulatory Visit (INDEPENDENT_AMBULATORY_CARE_PROVIDER_SITE_OTHER): Payer: Managed Care, Other (non HMO) | Admitting: Sports Medicine

## 2014-06-08 ENCOUNTER — Other Ambulatory Visit: Payer: Self-pay | Admitting: Sports Medicine

## 2014-06-08 VITALS — BP 134/88 | HR 96 | Ht 72.0 in | Wt 251.0 lb

## 2014-06-08 DIAGNOSIS — E785 Hyperlipidemia, unspecified: Secondary | ICD-10-CM | POA: Diagnosis not present

## 2014-06-08 DIAGNOSIS — E119 Type 2 diabetes mellitus without complications: Secondary | ICD-10-CM

## 2014-06-08 DIAGNOSIS — T8459XS Infection and inflammatory reaction due to other internal joint prosthesis, sequela: Secondary | ICD-10-CM

## 2014-06-08 DIAGNOSIS — Z96659 Presence of unspecified artificial knee joint: Secondary | ICD-10-CM

## 2014-06-08 LAB — POCT GLYCOSYLATED HEMOGLOBIN (HGB A1C): Hemoglobin A1C: 6.8

## 2014-06-08 MED ORDER — TRAMADOL HCL 50 MG PO TABS: ORAL_TABLET | ORAL | Status: DC

## 2014-06-08 NOTE — Assessment & Plan Note (Signed)
Previously was very high. Rechecking lipids.

## 2014-06-08 NOTE — Assessment & Plan Note (Signed)
Continue XigDuo, up-to-date on other diabetic screening measures. A1c is well controlled at 6.8.

## 2014-06-08 NOTE — Progress Notes (Signed)
  Subjective:    CC: Follow-up  HPI: Diabetes mellitus type 2: Declines pneumococcal vaccination, doing well on XigDuo.  Left knee history of infected unicondylar prosthesis: Doing well, currently on antibiotics, is starting to have some increasing swelling without any fevers or chills and only minimal pain.  Hyperlipidemia: Amenable to recheck lipids.  Past medical history, Surgical history, Family history not pertinant except as noted below, Social history, Allergies, and medications have been entered into the medical record, reviewed, and no changes needed.   Review of Systems: No fevers, chills, night sweats, weight loss, chest pain, or shortness of breath.   Objective:    General: Well Developed, well nourished, and in no acute distress.  Neuro: Alert and oriented x3, extra-ocular muscles intact, sensation grossly intact.  HEENT: Normocephalic, atraumatic, pupils equal round reactive to light, neck supple, no masses, no lymphadenopathy, thyroid nonpalpable.  Skin: Warm and dry, no rashes. Cardiac: Regular rate and rhythm, no murmurs rubs or gallops, no lower extremity edema.  Respiratory: Clear to auscultation bilaterally. Not using accessory muscles, speaking in full sentences. Left Knee: Well-healed arthroplasty scar, there is swelling with a palpable fluid wave without erythema, induration, or warmth. ROM normal in flexion and extension and lower leg rotation. Ligaments with solid consistent endpoints including ACL, PCL, LCL, MCL. Negative Mcmurray's and provocative meniscal tests. Non painful patellar compression. Patellar and quadriceps tendons unremarkable. Hamstring and quadriceps strength is normal.  Procedure: Real-time Ultrasound Guided aspiration/Injection of left knee Device: GE Logiq E  Verbal informed consent obtained.  Time-out conducted.  Noted no overlying erythema, induration, or other signs of local infection.  Skin prepped in a sterile fashion.  Local  anesthesia: Topical Ethyl chloride.  With sterile technique and under real time ultrasound guidance:  Noted significant hematoma, aspirated 20 mL of mildly cloudy, straw-colored fluid, nonpurulent, and injected 1 mL kenalog 40, 4 mL lidocaine. Completed without difficulty  Pain immediately resolved suggesting accurate placement of the medication.  Advised to call if fevers/chills, erythema, induration, drainage, or persistent bleeding.  Images permanently stored and available for review in the ultrasound unit.  Impression: Technically successful ultrasound guided injection.  Impression and Recommendations:    I spent 40 minutes with this patient, greater than 50% was face-to-face time counseling regarding the above diagnoses

## 2014-06-08 NOTE — Assessment & Plan Note (Addendum)
Post revision. Currently on antibiotics. He does have some swelling with effusion. Aspiration, low-dose injection. Fluid sent off for culture and cell counts.  We also did spend a significant amount of time discussing the emerging therapies, some which are disease modifying, for osteoarthritis.

## 2014-06-09 LAB — SYNOVIAL CELL COUNT + DIFF, W/ CRYSTALS
Crystals, Fluid: NONE SEEN
Eosinophils-Synovial: 0 % (ref 0–1)
Lymphocytes-Synovial Fld: 1 % (ref 0–20)
Monocyte/Macrophage: 5 % — ABNORMAL LOW (ref 50–90)
Neutrophil, Synovial: 94 % — ABNORMAL HIGH (ref 0–25)
WBC, Synovial: 3615 uL — ABNORMAL HIGH (ref 0–200)

## 2014-06-12 ENCOUNTER — Other Ambulatory Visit: Payer: Self-pay | Admitting: Sports Medicine

## 2014-06-12 LAB — BODY FLUID CULTURE
Culture: NO GROWTH
Gram Stain: NONE SEEN
Organism ID, Bacteria: NO GROWTH

## 2014-06-20 ENCOUNTER — Telehealth: Payer: Self-pay | Admitting: Sports Medicine

## 2014-06-20 LAB — COMPREHENSIVE METABOLIC PANEL WITH GFR
AST: 18 U/L (ref 0–37)
Alkaline Phosphatase: 72 U/L (ref 39–117)
BUN: 14 mg/dL (ref 6–23)
Calcium: 9.6 mg/dL (ref 8.4–10.5)
Creat: 1.23 mg/dL (ref 0.50–1.35)
Sodium: 138 meq/L (ref 135–145)
Total Protein: 7.2 g/dL (ref 6.0–8.3)

## 2014-06-20 LAB — LIPID PANEL
Cholesterol: 263 mg/dL — ABNORMAL HIGH (ref 0–200)
HDL: 38 mg/dL — ABNORMAL LOW (ref >=40)
LDL Cholesterol: 195 mg/dL — ABNORMAL HIGH (ref 0–99)
Total CHOL/HDL Ratio: 6.9 Ratio
Triglycerides: 149 mg/dL (ref <150)
VLDL: 30 mg/dL (ref 0–40)

## 2014-06-20 LAB — CBC
HCT: 46.4 % (ref 39.0–52.0)
Hemoglobin: 15.8 g/dL (ref 13.0–17.0)
MCH: 31.5 pg (ref 26.0–34.0)
MCHC: 34.1 g/dL (ref 30.0–36.0)
MCV: 92.6 fL (ref 78.0–100.0)
MPV: 10.3 fL (ref 8.6–12.4)
Platelets: 252 K/uL (ref 150–400)
RBC: 5.01 MIL/uL (ref 4.22–5.81)
RDW: 13.6 % (ref 11.5–15.5)
WBC: 10.3 10*3/uL (ref 4.0–10.5)

## 2014-06-20 LAB — COMPREHENSIVE METABOLIC PANEL
ALT: 23 U/L (ref 0–53)
Albumin: 4.5 g/dL (ref 3.5–5.2)
CO2: 20 mEq/L (ref 19–32)
Chloride: 101 mEq/L (ref 96–112)
Glucose, Bld: 110 mg/dL — ABNORMAL HIGH (ref 70–99)
Potassium: 4.4 mEq/L (ref 3.5–5.3)
Total Bilirubin: 0.7 mg/dL (ref 0.2–1.2)

## 2014-06-20 LAB — HEMOGLOBIN A1C
Hgb A1c MFr Bld: 6.9 % — ABNORMAL HIGH (ref <5.7)
Mean Plasma Glucose: 151 mg/dL — ABNORMAL HIGH (ref <117)

## 2014-06-20 LAB — TSH: TSH: 0.813 u[IU]/mL (ref 0.350–4.500)

## 2014-06-20 NOTE — Telephone Encounter (Signed)
Dr. Karie Schwalbe this patient called a little after 1pm today and request an appointment with you today I told him you did not have any openings today or this week except this Friday you do have a acute same day for Friday at 11:30am but he said that you recently pulled fluid out of his knee and now his knee is 2 times bigger and he is in pain. Patient states that he has labs yesterday and maybe you can look at labs. What do do you recommend for this patient? It doesn't seem like he can wait til Friday either. Thanks

## 2014-06-20 NOTE — Telephone Encounter (Signed)
Let's just have him come at 8:00 tomorrow morning and I will come in early.

## 2014-06-22 ENCOUNTER — Ambulatory Visit: Payer: Managed Care, Other (non HMO) | Admitting: Sports Medicine

## 2014-07-05 LAB — FUNGUS CULTURE W SMEAR: Smear Result: NONE SEEN

## 2014-07-19 LAB — AFB CULTURE WITH SMEAR (NOT AT ARMC): Acid Fast Smear: NONE SEEN

## 2014-07-31 ENCOUNTER — Ambulatory Visit (INDEPENDENT_AMBULATORY_CARE_PROVIDER_SITE_OTHER): Payer: Managed Care, Other (non HMO) | Admitting: Internal Medicine

## 2014-07-31 ENCOUNTER — Encounter: Payer: Self-pay | Admitting: Internal Medicine

## 2014-07-31 DIAGNOSIS — T8450XD Infection and inflammatory reaction due to unspecified internal joint prosthesis, subsequent encounter: Secondary | ICD-10-CM

## 2014-07-31 DIAGNOSIS — Z96659 Presence of unspecified artificial knee joint: Principal | ICD-10-CM

## 2014-07-31 DIAGNOSIS — T8459XD Infection and inflammatory reaction due to other internal joint prosthesis, subsequent encounter: Secondary | ICD-10-CM

## 2014-07-31 NOTE — Progress Notes (Signed)
Patient ID: Johnathan Barr, male   DOB: 10/21/1960, 54 y.o.   MRN: [id number]         [facility]  Patient Active Problem List   Diagnosis Date Noted  . History of Infected prosthetic knee joint, left 01/08/2014    Priority: High  . Insomnia 12/09/2013  . PVC (premature ventricular contraction) 11/04/2013  . Status post left partial knee replacement 09/16/2013  . Left knee pain 06/17/2013  . Obesity 06/17/2013  . Diabetes mellitus, type 2 04/26/2013  . Rotator cuff syndrome of right shoulder 03/22/2013  . Prepatellar bursitis of left knee 03/22/2013  . Left lumbar radiculitis 05/14/2012  . Hyperlipidemia with target LDL less than 100 05/14/2012  . Onychomycosis 11/18/2011  . Peroneal tendinitis 11/18/2011  . Preventive measure 11/18/2011  . Internal hemorrhoids 08/29/2011  . GERD (gastroesophageal reflux disease) 08/29/2011  . Hypertension 08/29/2011  . Lactose intolerance 08/29/2011    Patient's Medications  New Prescriptions   No medications on file  Previous Medications   BUPROPION (WELLBUTRIN XL) 150 MG 24 HR TABLET    TAKE 1 TABLET (150 MG TOTAL) BY MOUTH DAILY.   CIPROFLOXACIN (CIPRO) 750 MG TABLET    Take 1 tablet (750 mg total) by mouth 2 (two) times daily.   CYCLOBENZAPRINE (FLEXERIL) 10 MG TABLET    One half tab PO qHS, then increase gradually to one tab TID.   DAPAGLIFLOZIN-METFORMIN HCL ER (XIGDUO XR) 06-998 MG TB24    Take 1 tablet by mouth daily.   DOXYCYCLINE (VIBRA-TABS) 100 MG TABLET    Take 1 tablet (100 mg total) by mouth 2 (two) times daily.   FERROUS SULFATE 325 (65 FE) MG EC TABLET    Take 1 tablet (325 mg total) by mouth 3 (three) times daily with meals.   LISINOPRIL-HYDROCHLOROTHIAZIDE (PRINZIDE,ZESTORETIC) 10-12.5 MG PER TABLET    TAKE 1 TABLET BY MOUTH DAILY.   METOPROLOL SUCCINATE (TOPROL-XL) 50 MG 24 HR TABLET    TAKE 1 TABLET BY MOUTH DAILY WITH OR IMMEDIATELYFOLLOW A MEAL   MUPIROCIN OINTMENT (BACTROBAN) 2 %    Place 1  application into the nose 2 (two) times daily.   ONDANSETRON (ZOFRAN) 4 MG TABLET    Take 1 tablet (4 mg total) by mouth every 8 (eight) hours as needed for nausea or vomiting.   OXICONAZOLE (OXISTAT) 1 % LOTION    Apply to affected area daily   OXYCODONE-ACETAMINOPHEN (PERCOCET) 10-325 MG PER TABLET    Take 1-2 tablets by mouth every 6 (six) hours as needed for pain. MAXIMUM TOTAL ACETAMINOPHEN DOSE IS 4000 MG PER DAY   PANTOPRAZOLE (PROTONIX) 20 MG TABLET    Take 2 tablets (40 mg total) by mouth daily.   RIVAROXABAN (XARELTO) 10 MG TABS TABLET    Take 1 tablet (10 mg total) by mouth daily with breakfast.   SENNOSIDES-DOCUSATE SODIUM (SENOKOT-S) 8.6-50 MG TABLET    Take 2 tablets by mouth daily.   TRAMADOL (ULTRAM) 50 MG TABLET    1-2 tabs by mouth Q8 hours, maximum 6 tabs per day.  Modified Medications   No medications on file  Discontinued Medications   No medications on file    Subjective: Johnathan Barr is in for his routine follow-up visit for culture-negative left prosthetic knee infection. Completed about 5 and half months of total anabiotic therapy 2 weeks ago. He states that he's had 2 recent episodes where his knee became more swollen and painful. Both occurred before stopping anabiotic's. He had  fluid aspirated from his knee in April. Gram stain and cultures were negative at that time. Both times the swelling resolves spontaneously. He notes that both episodes occurred after driving a manual transmission car with a clutch. He is also been more active at home and at work. He has been riding his bicycle. He continues to require occasional doses of narcotic pain medication. He has not had any fever, chills or sweats. He had no problem tolerating his ciprofloxacin or doxycycline.  Review of Systems: Pertinent items are noted in HPI.  Past Medical History  Diagnosis Date  . Colon polyps     2  . Osteoarthritis of left knee 09/16/2013  . Diabetes mellitus   . Hypertension   .  Hyperlipidemia   . Hyperthyroidism   . Infected prosthetic knee joint, left 01/08/2014    History  Substance Use Topics  . Smoking status: Former Smoker -- 0.25 packs/day    Quit date: 09/12/2012  . Smokeless tobacco: Never Used  . Alcohol Use: 0.0 oz/week    0 Standard drinks or equivalent per week     Comment: almost daily    Family History  Problem Relation Age of Onset  . Ovarian cancer Mother   . Heart disease Father     CAD  . Lung cancer Paternal Uncle   . Lymphoma Cousin     Allergies  Allergen Reactions  . Vancomycin Hives    Objective: Temp: 98.8 F (37.1 C) (06/06 1634) Temp Source: Oral (06/06 1634) BP: 119/82 mmHg (06/06 1634) Pulse Rate: 81 (06/06 1634)  General: He is in good spirits Left knee incision is fully healed. There is only minimal warmth and swelling. He has some restricted flexion.  Lab Results SED RATE (mm/hr)  Date Value  03/23/2014 1  01/07/2014 100*  01/06/2014 109*   CRP (mg/dL)  Date Value  16/11/9602 <0.5  01/07/2014 8.8*     Assessment: I am hopeful that his infection has been cured but only time will tell.  Plan: 1. Continue observation off of anabiotics 2. Repeat sedimentation rate and C-reactive protein 3. Follow-up in 6 weeks   Johnathan Asters, MD [facility] for Infectious Disease [facility] [phone] pager   [phone] cell 07/31/2014, 5:09 PM

## 2014-08-01 LAB — SEDIMENTATION RATE: Sed Rate: 14 mm/hr (ref 0–20)

## 2014-08-01 LAB — C-REACTIVE PROTEIN: CRP: 1.9 mg/dL — AB (ref <0.60)

## 2014-08-02 ENCOUNTER — Other Ambulatory Visit: Payer: Self-pay | Admitting: Sports Medicine

## 2014-09-07 ENCOUNTER — Ambulatory Visit (INDEPENDENT_AMBULATORY_CARE_PROVIDER_SITE_OTHER): Payer: Managed Care, Other (non HMO) | Admitting: Sports Medicine

## 2014-09-07 ENCOUNTER — Encounter: Payer: Self-pay | Admitting: Sports Medicine

## 2014-09-07 VITALS — BP 122/80 | HR 89 | Ht 72.0 in | Wt 254.0 lb

## 2014-09-07 DIAGNOSIS — I10 Essential (primary) hypertension: Secondary | ICD-10-CM

## 2014-09-07 DIAGNOSIS — E119 Type 2 diabetes mellitus without complications: Secondary | ICD-10-CM

## 2014-09-07 DIAGNOSIS — I493 Ventricular premature depolarization: Secondary | ICD-10-CM

## 2014-09-07 DIAGNOSIS — E785 Hyperlipidemia, unspecified: Secondary | ICD-10-CM

## 2014-09-07 LAB — POCT GLYCOSYLATED HEMOGLOBIN (HGB A1C): Hemoglobin A1C: 6.3

## 2014-09-07 NOTE — Progress Notes (Signed)
  Subjective:    CC: Follow-up  HPI: Hyperlipidemia: Still resistant to cholesterol lowering medication. Amenable to try Lipitor now.  Hypertension: Controlled, has come off of his blood pressure medications.  Depression: Controlled  Post infected left total knee partial arthroplasty: Doing well, he is not surprisingly having lateral compartment pain.  PVCs: No improvement with metoprolol, it doesn't bother him, he has discontinued metoprolol.  Past medical history, Surgical history, Family history not pertinant except as noted below, Social history, Allergies, and medications have been entered into the medical record, reviewed, and no changes needed.   Review of Systems: No fevers, chills, night sweats, weight loss, chest pain, or shortness of breath.   Objective:    General: Well Developed, well nourished, and in no acute distress.  Neuro: Alert and oriented x3, extra-ocular muscles intact, sensation grossly intact.  HEENT: Normocephalic, atraumatic, pupils equal round reactive to light, neck supple, no masses, no lymphadenopathy, thyroid nonpalpable.  Skin: Warm and dry, no rashes. Cardiac: Regular rate and rhythm, no murmurs rubs or gallops, no lower extremity edema.  Respiratory: Clear to auscultation bilaterally. Not using accessory muscles, speaking in full sentences.  Impression and Recommendations:

## 2014-09-07 NOTE — Assessment & Plan Note (Signed)
Controlled, is off of all medication.

## 2014-09-07 NOTE — Assessment & Plan Note (Signed)
Don't bother him, has come off of metoprolol, feels the same.

## 2014-09-07 NOTE — Assessment & Plan Note (Signed)
Well controlled, A1c is 6.3. No changes.

## 2014-09-07 NOTE — Assessment & Plan Note (Signed)
Finally amenable to try Lipitor, recheck lipids in 3 months, fasting.

## 2014-09-07 NOTE — Patient Instructions (Signed)
Fibrates Bile acid binding resins Zetia Statins Repatha/Praluent Fish oils

## 2014-09-12 ENCOUNTER — Ambulatory Visit: Payer: Managed Care, Other (non HMO) | Admitting: Internal Medicine

## 2014-10-10 ENCOUNTER — Ambulatory Visit (INDEPENDENT_AMBULATORY_CARE_PROVIDER_SITE_OTHER): Payer: Managed Care, Other (non HMO) | Admitting: Sports Medicine

## 2014-10-10 ENCOUNTER — Encounter: Payer: Self-pay | Admitting: Sports Medicine

## 2014-10-10 ENCOUNTER — Other Ambulatory Visit: Payer: Self-pay | Admitting: Sports Medicine

## 2014-10-10 DIAGNOSIS — M25562 Pain in left knee: Secondary | ICD-10-CM

## 2014-10-10 DIAGNOSIS — E785 Hyperlipidemia, unspecified: Secondary | ICD-10-CM | POA: Diagnosis not present

## 2014-10-10 DIAGNOSIS — T8459XS Infection and inflammatory reaction due to other internal joint prosthesis, sequela: Secondary | ICD-10-CM

## 2014-10-10 DIAGNOSIS — Z96659 Presence of unspecified artificial knee joint: Secondary | ICD-10-CM

## 2014-10-10 DIAGNOSIS — I10 Essential (primary) hypertension: Secondary | ICD-10-CM | POA: Diagnosis not present

## 2014-10-10 MED ORDER — ATORVASTATIN CALCIUM 40 MG PO TABS: 40.0000 mg | ORAL_TABLET | Freq: Every day | ORAL | Status: DC

## 2014-10-10 NOTE — Assessment & Plan Note (Signed)
Post revision, history of antibiotic spacer, currently off of antibiotics. 4 months ago we did an aspiration and injection provided a good response, cell counts and culture were negative from the aspirate. Repeat aspiration with injection today, his pain is predominantly lateral, and he does need a total arthroplasty conversion. I am going to send fluid off again for cell counts, cultures. Referral to Dr. Turner Daniels.

## 2014-10-10 NOTE — Assessment & Plan Note (Signed)
Continuing on lisinopril.

## 2014-10-10 NOTE — Assessment & Plan Note (Signed)
Refilling Lipitor. 

## 2014-10-10 NOTE — Progress Notes (Signed)
  Subjective:    CC: follow-up  HPI: This is a pleasant 54 year old male, he is post-left partial knee arthroplasty, unfortunately ended up with a postoperative infection, and ended up needing revision of the entire prosthesis with antibiotic-impregnated spacer, and subsequent prolonged IV anti-biotics. He did overall well but unfortunately is having some pain over the lateral joint line, he responded well to an aspiration and injection 4 months ago,cell count and cultures were negative. He is having recurrence of swelling today desires repeat aspiration and injection, at this point he is amenable to discuss conversion to a total arthroplasty.  Hypertension: Improving on lisinopril 20  Hyperlipidemia: Stable on Lipitor 40.  Past medical history, Surgical history, Family history not pertinant except as noted below, Social history, Allergies, and medications have been entered into the medical record, reviewed, and no changes needed.   Review of Systems: No fevers, chills, night sweats, weight loss, chest pain, or shortness of breath.   Objective:    General: Well Developed, well nourished, and in no acute distress.  Neuro: Alert and oriented x3, extra-ocular muscles intact, sensation grossly intact.  HEENT: Normocephalic, atraumatic, pupils equal round reactive to light, neck supple, no masses, no lymphadenopathy, thyroid nonpalpable.  Skin: Warm and dry, no rashes. Cardiac: Regular rate and rhythm, no murmurs rubs or gallops, no lower extremity edema.  Respiratory: Clear to auscultation bilaterally. Not using accessory muscles, speaking in full sentences. Left knee: Well-healed arthroplasty scar, moderately swollen with a fluid wave without warmth, erythema,or induration.  Procedure: Real-time Ultrasound Guided aspiration/Injection of left knee Device: GE Logiq E  Verbal informed consent obtained.  Time-out conducted.  Noted no overlying erythema, induration, or other signs of local  infection.  Skin prepped in a sterile fashion.  Local anesthesia: Topical Ethyl chloride.  With sterile technique and under real time ultrasound guidance:  20 mL straw-colored fluid, cloudy aspirated, syringe switched and 1 mL kenalog 40, 3 mL lidocaine injected easily. Completed without difficulty  Pain immediately resolved suggesting accurate placement of the medication.  Advised to call if fevers/chills, erythema, induration, drainage, or persistent bleeding.  Images permanently stored and available for review in the ultrasound unit.  Impression: Technically successful ultrasound guided injection.  Impression and Recommendations:   I spent 25 minutes with this patient, greater than 50% was face-to-face time counseling regarding the above diagnoses

## 2014-10-11 LAB — SYNOVIAL CELL COUNT + DIFF, W/ CRYSTALS
Crystals, Fluid: NONE SEEN
Eosinophils-Synovial: 0 % (ref 0–1)
Lymphocytes-Synovial Fld: 5 % (ref 0–20)
Monocyte/Macrophage: 6 % — ABNORMAL LOW (ref 50–90)
Neutrophil, Synovial: 89 % — ABNORMAL HIGH (ref 0–25)
WBC, Synovial: 5050 cu mm — ABNORMAL HIGH (ref 0–200)

## 2014-10-14 LAB — BODY FLUID CULTURE
Culture: NO GROWTH
Gram Stain: NONE SEEN
Organism ID, Bacteria: NO GROWTH

## 2014-10-31 ENCOUNTER — Other Ambulatory Visit: Payer: Self-pay | Admitting: Sports Medicine

## 2014-11-06 LAB — FUNGUS CULTURE W SMEAR: Smear Result: NONE SEEN

## 2014-11-07 ENCOUNTER — Encounter: Payer: Self-pay | Admitting: Sports Medicine

## 2014-11-07 ENCOUNTER — Other Ambulatory Visit: Payer: Self-pay | Admitting: Orthopedic Surgery

## 2014-11-07 ENCOUNTER — Ambulatory Visit (INDEPENDENT_AMBULATORY_CARE_PROVIDER_SITE_OTHER): Payer: Managed Care, Other (non HMO) | Admitting: Sports Medicine

## 2014-11-07 VITALS — BP 145/82 | HR 81 | Ht 72.0 in | Wt 249.0 lb

## 2014-11-07 DIAGNOSIS — T8450XS Infection and inflammatory reaction due to unspecified internal joint prosthesis, sequela: Secondary | ICD-10-CM

## 2014-11-07 DIAGNOSIS — T8459XS Infection and inflammatory reaction due to other internal joint prosthesis, sequela: Secondary | ICD-10-CM

## 2014-11-07 DIAGNOSIS — Z96659 Presence of unspecified artificial knee joint: Principal | ICD-10-CM

## 2014-11-07 NOTE — Progress Notes (Signed)
  Subjective:    CC: Follow-up  HPI: Johnathan Barr returns regarding his left knee, he has had a prolonged course with a partial knee arthroplasty that was complicated by infection with removal of the prosthesis, antibiotics spacer, and reimplantation. He continued to have pain particularly at the lateral compartment, and has seen Dr. Turner Daniels and is scheduled for conversion to a total knee arthroplasty. We discussed his concerns, considering the prior infection and prolonged course, and reassurance was given.  Hypertension: Patient does not remember whether he took his medication or not. No headaches, visual changes, chest pain.  Past medical history, Surgical history, Family history not pertinant except as noted below, Social history, Allergies, and medications have been entered into the medical record, reviewed, and no changes needed.   Review of Systems: No fevers, chills, night sweats, weight loss, chest pain, or shortness of breath.   Objective:    General: Well Developed, well nourished, and in no acute distress.  Neuro: Alert and oriented x3, extra-ocular muscles intact, sensation grossly intact.  HEENT: Normocephalic, atraumatic, pupils equal round reactive to light, neck supple, no masses, no lymphadenopathy, thyroid nonpalpable.  Skin: Warm and dry, no rashes. Cardiac: Regular rate and rhythm, no murmurs rubs or gallops, no lower extremity edema.  Respiratory: Clear to auscultation bilaterally. Not using accessory muscles, speaking in full sentences.  Impression and Recommendations:    I spent 25 minutes with this patient, greater than 50% was face-to-face time counseling regarding the above diagnoses

## 2014-11-07 NOTE — Assessment & Plan Note (Signed)
Scheduled for conversion to a left total knee arthroplasty with Dr. Gean Birchwood.

## 2014-11-28 ENCOUNTER — Other Ambulatory Visit: Payer: Self-pay | Admitting: Sports Medicine

## 2014-12-02 LAB — AFB CULTURE WITH SMEAR (NOT AT ARMC): Acid Fast Smear: NONE SEEN

## 2014-12-07 ENCOUNTER — Encounter (HOSPITAL_COMMUNITY)
Admission: RE | Admit: 2014-12-07 | Discharge: 2014-12-07 | Disposition: A | Discharge status: Home / Self Care | Payer: Managed Care, Other (non HMO) | Source: Ambulatory Visit | Attending: Orthopedic Surgery | Admitting: Orthopedic Surgery

## 2014-12-07 ENCOUNTER — Encounter (HOSPITAL_COMMUNITY): Payer: Self-pay

## 2014-12-07 DIAGNOSIS — R9431 Abnormal electrocardiogram [ECG] [EKG]: Secondary | ICD-10-CM | POA: Diagnosis not present

## 2014-12-07 DIAGNOSIS — E785 Hyperlipidemia, unspecified: Secondary | ICD-10-CM | POA: Diagnosis not present

## 2014-12-07 DIAGNOSIS — Z01818 Encounter for other preprocedural examination: Secondary | ICD-10-CM | POA: Insufficient documentation

## 2014-12-07 DIAGNOSIS — K219 Gastro-esophageal reflux disease without esophagitis: Secondary | ICD-10-CM | POA: Insufficient documentation

## 2014-12-07 DIAGNOSIS — Z0183 Encounter for blood typing: Secondary | ICD-10-CM | POA: Diagnosis not present

## 2014-12-07 DIAGNOSIS — Z7984 Long term (current) use of oral hypoglycemic drugs: Secondary | ICD-10-CM | POA: Insufficient documentation

## 2014-12-07 DIAGNOSIS — Z79899 Other long term (current) drug therapy: Secondary | ICD-10-CM | POA: Insufficient documentation

## 2014-12-07 DIAGNOSIS — I10 Essential (primary) hypertension: Secondary | ICD-10-CM | POA: Insufficient documentation

## 2014-12-07 DIAGNOSIS — Z01812 Encounter for preprocedural laboratory examination: Secondary | ICD-10-CM | POA: Insufficient documentation

## 2014-12-07 DIAGNOSIS — E119 Type 2 diabetes mellitus without complications: Secondary | ICD-10-CM | POA: Insufficient documentation

## 2014-12-07 DIAGNOSIS — F17200 Nicotine dependence, unspecified, uncomplicated: Secondary | ICD-10-CM | POA: Insufficient documentation

## 2014-12-07 HISTORY — DX: Thoracic aortic aneurysm, without rupture, unspecified: I71.20

## 2014-12-07 HISTORY — DX: Thoracic aortic aneurysm, without rupture: I71.2

## 2014-12-07 HISTORY — DX: Gastro-esophageal reflux disease without esophagitis: K21.9

## 2014-12-07 HISTORY — DX: Ventricular premature depolarization: I49.3

## 2014-12-07 LAB — URINE MICROSCOPIC-ADD ON

## 2014-12-07 LAB — URINALYSIS, ROUTINE W REFLEX MICROSCOPIC
Bilirubin Urine: NEGATIVE
Glucose, UA: >1000 mg/dL — AB
Hgb urine dipstick: NEGATIVE
KETONES UR: NEGATIVE mg/dL
LEUKOCYTES UA: NEGATIVE
NITRITE: NEGATIVE
PROTEIN: NEGATIVE mg/dL
Specific Gravity, Urine: 1.034 — ABNORMAL HIGH (ref 1.005–1.030)
UROBILINOGEN UA: 0.2 mg/dL (ref 0.0–1.0)
pH: 6.5 (ref 5.0–8.0)

## 2014-12-07 LAB — CBC WITH DIFFERENTIAL/PLATELET
Basophils Absolute: 0 10*3/uL (ref 0.0–0.1)
Basophils Relative: 0 %
Eosinophils Absolute: 0.1 10*3/uL (ref 0.0–0.7)
Eosinophils Relative: 1 %
HEMATOCRIT: 45.2 % (ref 39.0–52.0)
HEMOGLOBIN: 15.2 g/dL (ref 13.0–17.0)
LYMPHS ABS: 3 10*3/uL (ref 0.7–4.0)
Lymphocytes Relative: 26 %
MCH: 30.7 pg (ref 26.0–34.0)
MCHC: 33.6 g/dL (ref 30.0–36.0)
MCV: 91.3 fL (ref 78.0–100.0)
MONOS PCT: 8 %
Monocytes Absolute: 0.9 10*3/uL (ref 0.1–1.0)
NEUTROS ABS: 7.3 10*3/uL (ref 1.7–7.7)
NEUTROS PCT: 65 %
Platelets: 232 10*3/uL (ref 150–400)
RBC: 4.95 MIL/uL (ref 4.22–5.81)
RDW: 12.7 % (ref 11.5–15.5)
WBC: 11.3 10*3/uL — ABNORMAL HIGH (ref 4.0–10.5)

## 2014-12-07 LAB — BASIC METABOLIC PANEL
Anion gap: 11 (ref 5–15)
BUN: 17 mg/dL (ref 6–20)
CHLORIDE: 102 mmol/L (ref 101–111)
CO2: 25 mmol/L (ref 22–32)
CREATININE: 1.08 mg/dL (ref 0.61–1.24)
Calcium: 9.5 mg/dL (ref 8.9–10.3)
GFR calc non Af Amer: >60 mL/min (ref >60)
GLUCOSE: 118 mg/dL — AB (ref 65–99)
Potassium: 4.1 mmol/L (ref 3.5–5.1)
Sodium: 138 mmol/L (ref 135–145)

## 2014-12-07 LAB — GLUCOSE, CAPILLARY: Glucose-Capillary: 133 mg/dL — ABNORMAL HIGH (ref 65–99)

## 2014-12-07 LAB — PROTIME-INR
INR: 1.05 (ref 0.00–1.49)
Prothrombin Time: 13.9 seconds (ref 11.6–15.2)

## 2014-12-07 LAB — SURGICAL PCR SCREEN
MRSA, PCR: NEGATIVE
Staphylococcus aureus: NEGATIVE

## 2014-12-07 LAB — TYPE AND SCREEN
ABO/RH(D): A POS
Antibody Screen: NEGATIVE

## 2014-12-07 LAB — APTT: aPTT: 29 seconds (ref 24–37)

## 2014-12-07 LAB — ABO/RH: ABO/RH(D): A POS

## 2014-12-07 NOTE — Progress Notes (Signed)
PCP is Consecohomas Thekkekandam  Cardiologist is Olga MillersBrian Crenshaw. Patient stated he saw Dr. Jens Somrenshaw for PVC's that was found on a prior EKG. Patient is asymptomatic and denied having any acute chest pain, discomfort, or shortness of breath  CBG on arrival to PAT was 133. Patient informed Nurse that he does not check his blood glucose at home

## 2014-12-07 NOTE — Pre-Procedure Instructions (Signed)
Patsy BaltimoreJohn E Falco  12/07/2014     Your procedure is scheduled on : Monday December 18, 2014.  [facility] Admitting at 9:50 A.M.  Call this number if you have problems the morning of surgery: [phone]    Remember:  Do not eat food or drink liquids after midnight.  Take these medicines the morning of surgery with A SIP OF WATER : Bupropion (Wellbutrin), Pantoprazole (Protonix)   Do NOT take any diabetic pills the morning of your surgery   Stop taking any vitamins, herbal medications, Meloxicam/Mobic, Amino Acid Complex, Ibuprofen, Advil, Motrin, Aleve, etc on Monday October 17th   How to Manage Your Diabetes Before Surgery   Why is it important to control my blood sugar before and after surgery?   Improving blood sugar levels before and after surgery helps healing and can limit problems.  A way of improving blood sugar control is eating a healthy diet by:  - Eating less sugar and carbohydrates  - Increasing activity/exercise  - Talk with your doctor about reaching your blood sugar goals  High blood sugars (greater than 180 mg/dL) can raise your risk of infections and slow down your recovery so you will need to focus on controlling your diabetes during the weeks before surgery.  Make sure that the doctor who takes care of your diabetes knows about your planned surgery including the date and location.  How do I manage my blood sugars before surgery?   Check your blood sugar at least 4 times a day, 2 days before surgery to make sure that they are not too high or low.   Check your blood sugar the morning of your surgery when you wake up and every 2 hours until you get to the Short-Stay unit.  If your blood sugar is less than 70 mg/dL, you will need to treat for low blood sugar by:  Treat a low blood sugar (less than 70 mg/dL) with 1/2 cup of clear juice (cranberry or apple), 4 glucose tablets, OR glucose gel.  Recheck blood sugar in 15 minutes after  treatment (to make sure it is greater than 70 mg/dL).  If blood sugar is not greater than 70 mg/dL on re-check, call [phone] for further instructions.   Report your blood sugar to the Short-Stay nurse when you get to Short-Stay.  References:  [facility], 2007 "How to Manage your Diabetes Before and After Surgery".  What do I do about my diabetes medications?   Do not take oral diabetes medicines (pills) the morning of surgery.   Do not wear jewelry, make-up or nail polish.  Do not wear lotions, powders, or perfumes.    Do not shave 48 hours prior to surgery.    Do not bring valuables to the hospital.  [facility] is not responsible for any belongings or valuables.  Contacts, dentures or bridgework may not be worn into surgery.  Leave your suitcase in the car.  After surgery it may be brought to your room.  For patients admitted to the hospital, discharge time will be determined by your treatment team.  Patients discharged the day of surgery will not be allowed to drive home.   Name and phone number of your driver:    Special instructions:  Shower using CHG soap the night before and the morning of your surgery  Please read over the following fact sheets that you were given. Pain Booklet, Coughing and Deep Breathing, Blood Transfusion Information, Total Joint  Packet, MRSA Information and Surgical Site Infection Prevention

## 2014-12-08 ENCOUNTER — Ambulatory Visit (INDEPENDENT_AMBULATORY_CARE_PROVIDER_SITE_OTHER): Payer: Managed Care, Other (non HMO) | Admitting: Sports Medicine

## 2014-12-08 ENCOUNTER — Encounter (HOSPITAL_COMMUNITY): Payer: Self-pay

## 2014-12-08 VITALS — BP 142/87 | HR 91 | Wt 249.0 lb

## 2014-12-08 DIAGNOSIS — T8459XS Infection and inflammatory reaction due to other internal joint prosthesis, sequela: Secondary | ICD-10-CM

## 2014-12-08 DIAGNOSIS — E785 Hyperlipidemia, unspecified: Secondary | ICD-10-CM | POA: Diagnosis not present

## 2014-12-08 DIAGNOSIS — L918 Other hypertrophic disorders of the skin: Secondary | ICD-10-CM | POA: Insufficient documentation

## 2014-12-08 DIAGNOSIS — Z96659 Presence of unspecified artificial knee joint: Secondary | ICD-10-CM

## 2014-12-08 LAB — HEMOGLOBIN A1C
Hgb A1c MFr Bld: 7.2 % — ABNORMAL HIGH (ref 4.8–5.6)
MEAN PLASMA GLUCOSE: 160 mg/dL

## 2014-12-08 MED ORDER — FUSION PLUS PO CAPS: 1.0000 | ORAL_CAPSULE | Freq: Every day | ORAL | Status: DC

## 2014-12-08 NOTE — Assessment & Plan Note (Signed)
Arthroplasty revision coming up. He did become profoundly anemic due to surgical blood loss after the previous partial knee replacement. I am going to go ahead and give him some iron pills to take pre-and postoperatively.

## 2014-12-08 NOTE — Assessment & Plan Note (Signed)
Cryotherapy of 2 ingrown hairs and a skin tag in the left axilla

## 2014-12-08 NOTE — Progress Notes (Addendum)
Anesthesia Chart Review:  Pt is 54 year old male scheduled for L total knee arthroplasty on 12/18/2014 with Dr. Turner Danielsowan.   PMH includes: HTN, hyperlipidemia, DM, GERD, PVCs, hyperthyroidism. Current smoker. BMI 34. S/p irrigation and debridement L knee 01/08/14. S/p L knee arthroplasty knee condyle and plateau medial compartment 09/16/13.   Medications include: lipitor, dapagliflozin-metformin, lisinopril, protonix.   Preoperative labs reviewed.  Glucose 118, hgbA1c 7.2.  Chest x-ray 12/07/2014 reviewed. No acute cardiopulmonary abnormality  EKG 12/07/2014: Sinus rhythm with occasional PVCs. Inferior infarct, age undetermined. since last tracing 01/07/14 no significant change per Dr. Hoyle BarrVaranasi's interpretation.   Echo 01/05/2014:  - Left ventricle: The cavity size was normal. There was mild concentric hypertrophy. Systolic function was normal. The estimated ejection fraction was in the range of 50% to 55%. Wall motion was normal; there were no regional wall motion abnormalities. Doppler parameters are consistent with abnormal left ventricular relaxation (grade 1 diastolic dysfunction). There was no evidence of elevated ventricular filling pressure by Doppler parameters. - Aortic valve: Transvalvular velocity was within the normal range. There was no stenosis. There was no regurgitation. - Aortic root: The aortic root was mildly dilated measuring 45 mm. - Ascending aorta: The ascending aorta was normal in size. - Mitral valve: Structurally normal valve. There was mild regurgitation. - Left atrium: The atrium was normal in size. - Right ventricle: Systolic function was normal. - Right atrium: The atrium was normal in size. - Tricuspid valve: There was mild regurgitation. - Pulmonary arteries: Systolic pressure was within the normal range. - Pericardium, extracardiac: There was no pericardial effusion.  CTA chest 01/12/2014: - Ectatic to mildly aneurysmal 3.5 cm ascending thoracic  aorta. Recommend annual imaging follow up by CTA or MRA.  If no changes, I anticipate pt can proceed with surgery as scheduled.   Rica Mastngela Draylen Lobue, FNP-BC [facility]/Anesthesiology Phone: [phone] 12/08/2014 3:48 PM  Addendum:  Nuclear stress test 12/11/2014:   Nuclear stress EF: 59%.  The left ventricular ejection fraction is normal (55-65%).  There was no ST segment deviation noted during stress.  This is a low risk study.  Pt saw Dr. Jens Somrenshaw 12/11/14 for cardiac pre-op eval, who ordered stress test. Dr. Jens Somrenshaw comments on stress test results that pt is ok for surgery.   If no changes, I anticipate pt can proceed with surgery as scheduled.   Rica Mastngela Antonella Upson, FNP-BC [facility]/Anesthesiology Phone: [phone] 12/12/2014 2:35 PM

## 2014-12-08 NOTE — Assessment & Plan Note (Signed)
Has been taking Lipitor 40 for months now, we have not yet rechecked his cholesterol. He does have an upcoming knee replacement, hopefully we can do fasting lipids while he is in the hospital.

## 2014-12-08 NOTE — Progress Notes (Signed)
  Subjective:    CC:  Follow-up  HPI: Hyperlipidemia: Due for blood work, has still not obtained it.  Left knee arthroplasty: revision is coming up. He did become profoundly iron anemic due to blood loss from the previous knee replacement, he agrees to go ahead and start pre-and postoperative iron supplementation.  Skin lesions: Right thigh and left axilla, minimally tender on the right anterior thigh, left axilla is simply a skin tag.  Past medical history, Surgical history, Family history not pertinant except as noted below, Social history, Allergies, and medications have been entered into the medical record, reviewed, and no changes needed.   Review of Systems: No fevers, chills, night sweats, weight loss, chest pain, or shortness of breath.   Objective:    General: Well Developed, well nourished, and in no acute distress.  Neuro: Alert and oriented x3, extra-ocular muscles intact, sensation grossly intact.  HEENT: Normocephalic, atraumatic, pupils equal round reactive to light, neck supple, no masses, no lymphadenopathy, thyroid nonpalpable.  Skin: Warm and dry, no rashes. There are to ingrown hairs on the right anterior thigh, there is a skin tag in the left axilla. Cardiac: Regular rate and rhythm, no murmurs rubs or gallops, no lower extremity edema.  Respiratory: Clear to auscultation bilaterally. Not using accessory muscles, speaking in full sentences.  Procedure:  Cryodestruction of 2 right sided anterior thigh ingrown hairs and a left axillary skin tag Consent obtained and verified. Time-out conducted. Noted no overlying erythema, induration, or other signs of local infection. Completed without difficulty using Cryo-Gun. Advised to call if fevers/chills, erythema, induration, drainage, or persistent bleeding.  Impression and Recommendations:

## 2014-12-11 ENCOUNTER — Encounter: Payer: Self-pay | Admitting: Cardiology

## 2014-12-11 ENCOUNTER — Telehealth: Payer: Self-pay | Admitting: Cardiology

## 2014-12-11 ENCOUNTER — Ambulatory Visit (INDEPENDENT_AMBULATORY_CARE_PROVIDER_SITE_OTHER): Payer: Managed Care, Other (non HMO) | Admitting: Cardiology

## 2014-12-11 VITALS — BP 142/80 | HR 66 | Ht 72.0 in | Wt 248.9 lb

## 2014-12-11 DIAGNOSIS — Z01818 Encounter for other preprocedural examination: Secondary | ICD-10-CM

## 2014-12-11 DIAGNOSIS — R9431 Abnormal electrocardiogram [ECG] [EKG]: Secondary | ICD-10-CM

## 2014-12-11 DIAGNOSIS — Z01810 Encounter for preprocedural cardiovascular examination: Secondary | ICD-10-CM

## 2014-12-11 DIAGNOSIS — I10 Essential (primary) hypertension: Secondary | ICD-10-CM

## 2014-12-11 NOTE — Assessment & Plan Note (Signed)
Electrocardiogram suggests prior inferior infarct. He does not have a significant cardiac history. Given knee pain he cannot ambulate on treadmill. Plan Lexiscan nuclear study to evaluate perfusion and rule out infarct/coronary artery disease.

## 2014-12-11 NOTE — Assessment & Plan Note (Signed)
Continue statin. 

## 2014-12-11 NOTE — Assessment & Plan Note (Signed)
Blood pressure controlled. Continue present medications. 

## 2014-12-11 NOTE — Telephone Encounter (Signed)
Received [facility] for appointment on 12/11/14 with Dr Jens Somrenshaw.  Records given to Foye Spurling Mathis RN for Dr Ludwig Clarksrenshaw's schedule on 12/11/14. lp

## 2014-12-11 NOTE — Assessment & Plan Note (Signed)
Plan Lexiscan nuclear study for risk stratification. 

## 2014-12-11 NOTE — Progress Notes (Signed)
HPI: 54 year old male for preoperative evaluation prior to knee replacement. Patient has had PVCs documented previously. Previous TSH and potassium in September 2015 normal. He states he had a stress echocardiogram in 2004 that was unremarkable. Echocardiogram November 2015 showed normal LV function, grade 1 diastolic dysfunction, dilated aortic root at 45 mm, mild mitral regurgitation and tricuspid regurgitation. CTA November 2015 showed 3.5 cm descending thoracic aorta. FU recommended one year. Patient denies dyspnea on exertion but has limited mobility because of knee pain. He denies orthopnea, PND, pedal edema or exertional chest pain.  Current Outpatient Prescriptions  Medication Sig Dispense Refill  . atorvastatin (LIPITOR) 40 MG tablet Take 1 tablet (40 mg total) by mouth daily. 30 tablet 11  . B Complex Vitamins (B COMPLEX-B12 PO) Take 1 tablet by mouth daily.    Marland Kitchen. buPROPion (WELLBUTRIN XL) 150 MG 24 hr tablet TAKE 1 TABLET BY MOUTH DAILY. 90 tablet 0  . cholecalciferol (VITAMIN D) 1000 UNITS tablet Take 1,000 Units by mouth 2 (two) times daily.    . cyclobenzaprine (FLEXERIL) 10 MG tablet Take 10 mg by mouth 3 (three) times daily as needed for muscle spasms.    . Dapagliflozin-Metformin HCl ER (XIGDUO XR) 06-998 MG TB24 Take 1 tablet by mouth daily. 30 tablet 11  . Iron-FA-B Cmp-C-Biot-Probiotic (FUSION PLUS) CAPS Take 1 capsule by mouth daily. 30 capsule 11  . lisinopril (PRINIVIL,ZESTRIL) 20 MG tablet Take 1 tablet (20 mg total) by mouth daily. 1 tablet 0  . meloxicam (MOBIC) 15 MG tablet TAKE 1 TABLET BY MOUTH EVERY MORNING WITH BREAKFAST FOR 2WKS THEN DAILY AS NEEDED FOR PAIN (Patient taking differently: TAKE 1 TABLET BY MOUTH EVERYDAY AS NEEDED) 90 tablet 3  . OVER THE COUNTER MEDICATION Take 4 capsules by mouth at bedtime. Amino Acid Complex    . pantoprazole (PROTONIX) 20 MG tablet Take 2 tablets (40 mg total) by mouth daily. (Patient taking differently: Take 20 mg by mouth  daily. ) 180 tablet 3  . vitamin C (ASCORBIC ACID) 500 MG tablet Take 500 mg by mouth 2 (two) times daily.     No current facility-administered medications for this visit.    Allergies  Allergen Reactions  . Vancomycin Hives    "Red man syndrome"     Past Medical History  Diagnosis Date  . Colon polyps     2  . Osteoarthritis of left knee 09/16/2013  . Hypertension   . Hyperlipidemia   . Hyperthyroidism   . 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 GERD (gastroesophageal reflux disease)   . Diabetes mellitus (HCC)     Type 2  . PVC's (premature ventricular contractions)   . Thoracic aortic aneurysm (HCC)     01/12/14 ectatic to mildly aneurysmal 3.5cm ascending thoracic aorta; needs recheck 2 yrs    Past Surgical History  Procedure Laterality Date  . Knee arthroscopy w/ medial collateral ligament (mcl) repair  2002    left  . Colonoscopy    . Partial knee arthroplasty Left 09/16/2013    Procedure: LEFT KNEE: ARTHROPLASTY KNEE CONDYLE AND PLATEAU MEDIAL COMPARTMENT;  Surgeon: Eulas PostJoshua P Landau, MD;  Location: [facility];  Service: Orthopedics;  Laterality: Left;  . I&d extremity Left 01/08/2014    Procedure: IRRIGATION AND DEBRIDEMENT EXTREMITY LEFT KNEE;  Surgeon: Eulas PostJoshua P Landau, MD;  Location: [facility] OR;  Service: Orthopedics;  Laterality: Left;  . Total knee revision Left 01/08/2014    Procedure: LEFT KNEE IRRIGATION & DEBRIDEMENT  WITH EXCHANGE OF POLYETHALINE ;  Surgeon: [name], MD;  Location: [facility];  Service: Orthopedics;  Laterality: Left;    Social History   Social History  . Marital Status: Divorced    Spouse Name: N/A  . Number of Children: 3  . Years of Education: N/A   Occupational History  .      Retail banker   Social History Main Topics  . Smoking status: Current Some Day Smoker -- 0.25 packs/day    Types: E-cigarettes    Last Attempt to Quit: 09/12/2012  . Smokeless tobacco: Never Used  . Alcohol Use: 0.0 oz/week      0 Standard drinks or equivalent per week     Comment: almost daily  . Drug Use: No  . Sexual Activity: Yes    Birth Control/ Protection: None     Comment: vapo   Other Topics Concern  . Not on file   Social History Narrative   You have been given a separate informational sheet regarding your tobacco use, the importance of quitting and local resources to help you quit.          Family History  Problem Relation Age of Onset  . Ovarian cancer Mother   . Heart disease Father     CAD  . Lung cancer Paternal Uncle   . Lymphoma Cousin     ROS:  Knee arthralgias but no fevers or chills, productive cough, hemoptysis, dysphasia, odynophagia, melena, hematochezia, dysuria, hematuria, rash, seizure activity, orthopnea, PND, pedal edema, claudication. Remaining systems are negative.  Physical Exam:   Blood pressure 142/80, pulse 66, height 6' (1.829 m), weight 112.9 kg (248 lb 14.4 oz).  General:  Well developed/well nourished in NAD Skin warm/dry Patient not depressed No peripheral clubbing Back-normal HEENT-normal/normal eyelids Neck supple/normal carotid upstroke bilaterally; no bruits; no JVD; no thyromegaly chest - CTA/ normal expansion CV - RRR/normal S1 and S2; no murmurs, rubs or gallops;  PMI nondisplaced Abdomen -NT/ND, no HSM, no mass, + bowel sounds, no bruit 2+ femoral pulses, no bruits Ext-no edema, chords, 2+ DP Neuro-grossly nonfocal  ECG 12/07/2014-sinus rhythm, PVCs, inferior infarct. No change compared to 11/15.

## 2014-12-11 NOTE — Patient Instructions (Signed)
Your physician wants you to follow-up in: ONE YEAR WITH DR CRENSHAW You will receive a reminder letter in the mail two months in advance. If you don't receive a letter, please call our office to schedule the follow-up appointment.   Your physician has requested that you have a lexiscan myoview. For further information please visit [email or web address]. Please follow instruction sheet, as given.   

## 2014-12-12 ENCOUNTER — Ambulatory Visit (HOSPITAL_COMMUNITY)
Admission: RE | Admit: 2014-12-12 | Discharge: 2014-12-12 | Disposition: A | Discharge status: Home / Self Care | Payer: Managed Care, Other (non HMO) | Source: Ambulatory Visit | Attending: Cardiovascular Disease | Admitting: Cardiovascular Disease

## 2014-12-12 DIAGNOSIS — E119 Type 2 diabetes mellitus without complications: Secondary | ICD-10-CM | POA: Diagnosis not present

## 2014-12-12 DIAGNOSIS — Z01818 Encounter for other preprocedural examination: Secondary | ICD-10-CM | POA: Insufficient documentation

## 2014-12-12 DIAGNOSIS — F17200 Nicotine dependence, unspecified, uncomplicated: Secondary | ICD-10-CM | POA: Diagnosis not present

## 2014-12-12 DIAGNOSIS — R002 Palpitations: Secondary | ICD-10-CM | POA: Insufficient documentation

## 2014-12-12 DIAGNOSIS — R9439 Abnormal result of other cardiovascular function study: Secondary | ICD-10-CM | POA: Insufficient documentation

## 2014-12-12 DIAGNOSIS — I10 Essential (primary) hypertension: Secondary | ICD-10-CM | POA: Diagnosis not present

## 2014-12-12 LAB — MYOCARDIAL PERFUSION IMAGING
CHL CUP RESTING HR STRESS: 64 {beats}/min
CSEPPHR: 86 {beats}/min
LVDIAVOL: 114 mL
LVSYSVOL: 47 mL
NUC STRESS TID: 1.19
SDS: 0
SRS: 2
SSS: 2

## 2014-12-12 MED ORDER — TECHNETIUM TC 99M SESTAMIBI GENERIC - CARDIOLITE
10.4000 | Freq: Once | INTRAVENOUS | Status: AC | PRN
  Administered 2014-12-12: 10.4 via INTRAVENOUS

## 2014-12-12 MED ORDER — TECHNETIUM TC 99M SESTAMIBI GENERIC - CARDIOLITE
32.9000 | Freq: Once | INTRAVENOUS | Status: AC | PRN
  Administered 2014-12-12: 32.9 via INTRAVENOUS

## 2014-12-12 MED ORDER — REGADENOSON 0.4 MG/5ML IV SOLN
0.4000 mg | Freq: Once | INTRAVENOUS | Status: AC
  Administered 2014-12-12: 0.4 mg via INTRAVENOUS

## 2014-12-12 NOTE — H&P (Signed)
TOTAL KNEE REVISION ADMISSION H&P  Patient is being admitted for left revision total knee arthroplasty.  Subjective:  Chief Complaint:left knee pain.  HPI: Johnathan Barr, 54 y.o. male, has a history of pain and functional disability in the left knee(s) due to failed previous arthroplasty and patient has failed non-surgical conservative treatments for greater than 12 weeks to include NSAID's and/or analgesics, flexibility and strengthening excercises, supervised PT with diminished ADL's post treatment and activity modification. The indications for the revision of the total knee arthroplasty are bearing surface wear leading to symptomatic synovitis and implant or knee misalignment. Onset of symptoms was gradual starting 1 years ago with gradually worsening course since that time.  Prior procedures on the left knee(s) include unicompartmental arthroplasty.  Patient currently rates pain in the left knee(s) at 10 out of 10 with activity. There is night pain, worsening of pain with activity and weight bearing, pain that interferes with activities of daily living and pain with passive range of motion.  Patient has evidence of joint space narrowing by imaging studies. This condition presents safety issues increasing the risk of falls.   There is no current active infection.  Patient Active Problem List   Diagnosis Date Noted  . Preop cardiovascular exam 12/11/2014  . Nonspecific abnormal electrocardiogram (ECG) (EKG) 12/11/2014  . Skin tag 12/08/2014  . 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 Insomnia 12/09/2013  . PVC (premature ventricular contraction) 11/04/2013  . Obesity 06/17/2013  . Diabetes mellitus, type 2 (HCC) 04/26/2013  . Rotator cuff syndrome of right shoulder 03/22/2013  . Left lumbar radiculitis 05/14/2012  . Hyperlipidemia with target LDL less than 100 05/14/2012  . Preventive measure 11/18/2011  . Internal hemorrhoids 08/29/2011  . GERD (gastroesophageal  reflux disease) 08/29/2011  . Hypertension 08/29/2011  . Lactose intolerance 08/29/2011   Past Medical History  Diagnosis Date  . Colon polyps     2  . Osteoarthritis of left knee 09/16/2013  . Hypertension   . Hyperlipidemia   . Hyperthyroidism   . 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 GERD (gastroesophageal reflux disease)   . Diabetes mellitus (HCC)     Type 2  . PVC's (premature ventricular contractions)   . Thoracic aortic aneurysm (HCC)     01/12/14 ectatic to mildly aneurysmal 3.5cm ascending thoracic aorta; needs recheck 2 yrs    Past Surgical History  Procedure Laterality Date  . Knee arthroscopy w/ medial collateral ligament (mcl) repair  2002    left  . Colonoscopy    . Partial knee arthroplasty Left 09/16/2013    Procedure: LEFT KNEE: ARTHROPLASTY KNEE CONDYLE AND PLATEAU MEDIAL COMPARTMENT;  Surgeon: Eulas PostJoshua P Landau, MD;  Location: [facility];  Service: Orthopedics;  Laterality: Left;  . I&d extremity Left 01/08/2014    Procedure: IRRIGATION AND DEBRIDEMENT EXTREMITY LEFT KNEE;  Surgeon: Eulas PostJoshua P Landau, MD;  Location: [facility] OR;  Service: Orthopedics;  Laterality: Left;  . Total knee revision Left 01/08/2014    Procedure: LEFT KNEE IRRIGATION & DEBRIDEMENT WITH EXCHANGE OF POLYETHALINE ;  Surgeon: Eulas PostJoshua P Landau, MD;  Location: [facility] OR;  Service: Orthopedics;  Laterality: Left;    No prescriptions prior to admission   Allergies  Allergen Reactions  . Vancomycin Hives    "Red man syndrome"    Social History  Substance Use Topics  . Smoking status: Current Some Day Smoker -- 0.25 packs/day    Types: E-cigarettes    Last Attempt to Quit:  09/12/2012  . Smokeless tobacco: Never Used  . Alcohol Use: 0.0 oz/week    0 Standard drinks or equivalent per week     Comment: almost daily    Family History  Problem Relation Age of Onset  . Ovarian cancer Mother   . Heart disease Father     CAD  . Lung cancer Paternal Uncle   . Lymphoma Cousin        Review of Systems  Constitutional: Negative.   HENT: Negative.   Eyes: Negative.   Respiratory: Negative.   Cardiovascular:       HTN, abnormal ECG, Irregular heart beat  Gastrointestinal: Negative.   Genitourinary: Negative.   Musculoskeletal: Positive for joint pain.  Skin: Negative.   Neurological: Negative.   Endo/Heme/Allergies:       Blood sugar problem  Psychiatric/Behavioral: Negative.      Objective:  Physical Exam  Constitutional: He is oriented to person, place, and time. He appears well-developed and well-nourished.  HENT:  Head: Normocephalic and atraumatic.  Eyes: Pupils are equal, round, and reactive to light.  Neck: Normal range of motion. Neck supple.  Respiratory: Effort normal.  Musculoskeletal: He exhibits tenderness.  Skin is soft the wound is supple there is no palpable effusion, collateral ligaments are stable.  He is tender along the medial and lateral joint lines.  Neurological: He is alert and oriented to person, place, and time.  Skin: Skin is warm and dry.  Psychiatric: He has a normal mood and affect. His behavior is normal. Judgment and thought content normal.    Vital signs in last 24 hours: Pulse Rate:  [66] 66 (10/17 1510) BP: (142)/(80) 142/80 mmHg (10/17 1510) Weight:  [112.492 kg (248 lb)-112.9 kg (248 lb 14.4 oz)] 112.492 kg (248 lb) (10/18 0811)  Labs:  Estimated body mass index is 33.76 kg/(m^2) as calculated from the following:   Height as of 10/10/14: 6' (1.829 m).   Weight as of 11/07/14: 112.946 kg (249 lb).  Imaging Review Plain radiographs demonstrate AP and lateral x-rays of the left knee show unicompartmental total knee with components and what appears to be a stable condition.  The lateral side of the joint unfortunately is now down to almost bone-on-bone, which is a fairly impressive change over the last 12 months when he had a normal layer of cartilage laterally.  Assessment/Plan:  End stage arthritis, left  knee(s) with failed previous arthroplasty.   End-stage arthritis of the lateral side of the left knee with a medial compartment unicondylar total knee that is undergone poly-exchange after an infection that occurred a few months after the index procedure 09/16/2013.  The patient history, physical examination, clinical judgment of the provider and imaging studies are consistent with end stage degenerative joint disease of the left knee(s), previous total knee arthroplasty. Revision total knee arthroplasty is deemed medically necessary. The treatment options including medical management, injection therapy, arthroscopy and revision arthroplasty were discussed at length. The risks and benefits of revision total knee arthroplasty were presented and reviewed. The risks due to aseptic loosening, infection, stiffness, patella tracking problems, thromboembolic complications and other imponderables were discussed. The patient acknowledged the explanation, agreed to proceed with the plan and consent was signed. Patient is being admitted for inpatient treatment for surgery, pain control, PT, OT, prophylactic antibiotics, VTE prophylaxis, progressive ambulation and ADL's and discharge planning.The patient is planning to be discharged home with home health services

## 2014-12-13 ENCOUNTER — Telehealth: Payer: Self-pay | Admitting: *Deleted

## 2014-12-13 NOTE — Telephone Encounter (Signed)
Nuclear myoview results with clearance for surgery included faxed to the number provided.

## 2014-12-15 NOTE — Progress Notes (Signed)
Pt. Stated he was already aware of the time change for surgery. York SpanielSaid he will arrive 5:30 Monday, Oct. 24.

## 2014-12-17 MED ORDER — DEXTROSE-NACL 5-0.45 % IV SOLN: INTRAVENOUS | Status: DC

## 2014-12-17 MED ORDER — CHLORHEXIDINE GLUCONATE 4 % EX LIQD: 60.0000 mL | Freq: Once | CUTANEOUS | Status: DC

## 2014-12-17 MED ORDER — CEFAZOLIN SODIUM-DEXTROSE 2-3 GM-% IV SOLR
2.0000 g | INTRAVENOUS | Status: AC
  Administered 2014-12-18: 2 g via INTRAVENOUS
  Filled 2014-12-17: qty 50

## 2014-12-18 ENCOUNTER — Inpatient Hospital Stay (HOSPITAL_COMMUNITY): Payer: Managed Care, Other (non HMO) | Admitting: Vascular Surgery

## 2014-12-18 ENCOUNTER — Encounter (HOSPITAL_COMMUNITY)
Admission: RE | Disposition: A | Discharge status: Home Health | Payer: Self-pay | Source: Ambulatory Visit | Attending: Orthopedic Surgery

## 2014-12-18 ENCOUNTER — Inpatient Hospital Stay (HOSPITAL_COMMUNITY): Payer: Managed Care, Other (non HMO) | Admitting: Emergency Medicine

## 2014-12-18 ENCOUNTER — Inpatient Hospital Stay (HOSPITAL_COMMUNITY)
Admission: RE | Admit: 2014-12-18 | Discharge: 2014-12-20 | DRG: 467 | Disposition: A | Discharge status: Home Health | Payer: Managed Care, Other (non HMO) | Source: Ambulatory Visit | Attending: Orthopedic Surgery | Admitting: Orthopedic Surgery

## 2014-12-18 ENCOUNTER — Encounter (HOSPITAL_COMMUNITY): Payer: Self-pay | Admitting: Certified Registered Nurse Anesthetist

## 2014-12-18 DIAGNOSIS — E785 Hyperlipidemia, unspecified: Secondary | ICD-10-CM | POA: Diagnosis present

## 2014-12-18 DIAGNOSIS — F17200 Nicotine dependence, unspecified, uncomplicated: Secondary | ICD-10-CM | POA: Diagnosis present

## 2014-12-18 DIAGNOSIS — Z96652 Presence of left artificial knee joint: Secondary | ICD-10-CM

## 2014-12-18 DIAGNOSIS — I10 Essential (primary) hypertension: Secondary | ICD-10-CM | POA: Diagnosis present

## 2014-12-18 DIAGNOSIS — M1712 Unilateral primary osteoarthritis, left knee: Secondary | ICD-10-CM | POA: Diagnosis present

## 2014-12-18 DIAGNOSIS — M1710 Unilateral primary osteoarthritis, unspecified knee: Secondary | ICD-10-CM | POA: Diagnosis present

## 2014-12-18 DIAGNOSIS — T84093A Other mechanical complication of internal left knee prosthesis, initial encounter: Secondary | ICD-10-CM | POA: Diagnosis present

## 2014-12-18 DIAGNOSIS — Z6833 Body mass index (BMI) 33.0-33.9, adult: Secondary | ICD-10-CM

## 2014-12-18 DIAGNOSIS — E119 Type 2 diabetes mellitus without complications: Secondary | ICD-10-CM | POA: Diagnosis present

## 2014-12-18 DIAGNOSIS — E669 Obesity, unspecified: Secondary | ICD-10-CM | POA: Diagnosis present

## 2014-12-18 DIAGNOSIS — Z881 Allergy status to other antibiotic agents status: Secondary | ICD-10-CM

## 2014-12-18 DIAGNOSIS — K219 Gastro-esophageal reflux disease without esophagitis: Secondary | ICD-10-CM | POA: Diagnosis present

## 2014-12-18 DIAGNOSIS — D62 Acute posthemorrhagic anemia: Secondary | ICD-10-CM | POA: Diagnosis not present

## 2014-12-18 HISTORY — PX: TOTAL KNEE ARTHROPLASTY WITH REVISION COMPONENTS: SHX6198

## 2014-12-18 LAB — GLUCOSE, CAPILLARY
Glucose-Capillary: 110 mg/dL — ABNORMAL HIGH (ref 65–99)
Glucose-Capillary: 140 mg/dL — ABNORMAL HIGH (ref 65–99)

## 2014-12-18 LAB — GRAM STAIN

## 2014-12-18 SURGERY — TOTAL KNEE ARTHROPLASTY WITH REVISION COMPONENTS
Anesthesia: General | Site: Knee | Laterality: Left

## 2014-12-18 MED ORDER — ACETAMINOPHEN 650 MG RE SUPP: 650.0000 mg | Freq: Four times a day (QID) | RECTAL | Status: DC | PRN

## 2014-12-18 MED ORDER — OXYCODONE HCL 5 MG PO TABS
5.0000 mg | ORAL_TABLET | ORAL | Status: DC | PRN
  Administered 2014-12-18: 10 mg via ORAL
  Administered 2014-12-18: 5 mg via ORAL
  Administered 2014-12-19 – 2014-12-20 (×7): 10 mg via ORAL
  Filled 2014-12-18 (×8): qty 2

## 2014-12-18 MED ORDER — EPHEDRINE SULFATE 50 MG/ML IJ SOLN
INTRAMUSCULAR | Status: AC
  Filled 2014-12-18: qty 1

## 2014-12-18 MED ORDER — HYDROMORPHONE HCL 1 MG/ML IJ SOLN
0.2500 mg | INTRAMUSCULAR | Status: DC | PRN
  Administered 2014-12-18 (×2): 0.5 mg via INTRAVENOUS
  Administered 2014-12-18: 0.25 mg via INTRAVENOUS
  Administered 2014-12-18: 0.5 mg via INTRAVENOUS
  Administered 2014-12-18: 0.25 mg via INTRAVENOUS

## 2014-12-18 MED ORDER — HYDROMORPHONE HCL 1 MG/ML IJ SOLN
INTRAMUSCULAR | Status: AC
  Filled 2014-12-18: qty 2

## 2014-12-18 MED ORDER — LIDOCAINE HCL (CARDIAC) 20 MG/ML IV SOLN
INTRAVENOUS | Status: DC | PRN
  Administered 2014-12-18: 70 mg via INTRAVENOUS

## 2014-12-18 MED ORDER — SODIUM CHLORIDE 0.9 % IJ SOLN
INTRAMUSCULAR | Status: DC | PRN
  Administered 2014-12-18: 40 mL

## 2014-12-18 MED ORDER — BISACODYL 5 MG PO TBEC: 5.0000 mg | DELAYED_RELEASE_TABLET | Freq: Every day | ORAL | Status: DC | PRN

## 2014-12-18 MED ORDER — SODIUM CHLORIDE 0.9 % IR SOLN
Status: DC | PRN
  Administered 2014-12-18: 3000 mL

## 2014-12-18 MED ORDER — PROMETHAZINE HCL 25 MG/ML IJ SOLN: 6.2500 mg | INTRAMUSCULAR | Status: DC | PRN

## 2014-12-18 MED ORDER — METFORMIN HCL ER 500 MG PO TB24
1000.0000 mg | ORAL_TABLET | Freq: Every day | ORAL | Status: DC
  Administered 2014-12-19 – 2014-12-20 (×2): 1000 mg via ORAL
  Filled 2014-12-18 (×3): qty 2

## 2014-12-18 MED ORDER — FENTANYL CITRATE (PF) 100 MCG/2ML IJ SOLN
INTRAMUSCULAR | Status: DC | PRN
Start: 2014-12-18 — End: 2014-12-18
  Administered 2014-12-18 (×3): 50 ug via INTRAVENOUS
  Administered 2014-12-18: 100 ug via INTRAVENOUS

## 2014-12-18 MED ORDER — CEFUROXIME SODIUM 1.5 G IJ SOLR
INTRAMUSCULAR | Status: DC | PRN
  Administered 2014-12-18: 1.5 g

## 2014-12-18 MED ORDER — ONDANSETRON HCL 4 MG/2ML IJ SOLN: 4.0000 mg | Freq: Four times a day (QID) | INTRAMUSCULAR | Status: DC | PRN

## 2014-12-18 MED ORDER — FLEET ENEMA 7-19 GM/118ML RE ENEM: 1.0000 | ENEMA | Freq: Once | RECTAL | Status: DC | PRN

## 2014-12-18 MED ORDER — METHOCARBAMOL 1000 MG/10ML IJ SOLN
500.0000 mg | INTRAVENOUS | Status: AC
  Administered 2014-12-18: 500 mg via INTRAVENOUS
  Filled 2014-12-18: qty 5

## 2014-12-18 MED ORDER — CEFUROXIME SODIUM 1.5 G IJ SOLR
INTRAMUSCULAR | Status: AC
  Filled 2014-12-18: qty 1.5

## 2014-12-18 MED ORDER — MIDAZOLAM HCL 2 MG/2ML IJ SOLN
INTRAMUSCULAR | Status: AC
  Filled 2014-12-18: qty 4

## 2014-12-18 MED ORDER — PHENYLEPHRINE 40 MCG/ML (10ML) SYRINGE FOR IV PUSH (FOR BLOOD PRESSURE SUPPORT)
PREFILLED_SYRINGE | INTRAVENOUS | Status: AC
  Filled 2014-12-18: qty 10

## 2014-12-18 MED ORDER — MENTHOL 3 MG MT LOZG: 1.0000 | LOZENGE | OROMUCOSAL | Status: DC | PRN

## 2014-12-18 MED ORDER — ROCURONIUM BROMIDE 50 MG/5ML IV SOLN
INTRAVENOUS | Status: AC
  Filled 2014-12-18: qty 1

## 2014-12-18 MED ORDER — METHOCARBAMOL 500 MG PO TABS: 500.0000 mg | ORAL_TABLET | Freq: Two times a day (BID) | ORAL | Status: DC

## 2014-12-18 MED ORDER — ATORVASTATIN CALCIUM 40 MG PO TABS
40.0000 mg | ORAL_TABLET | Freq: Every day | ORAL | Status: DC
  Administered 2014-12-18 – 2014-12-19 (×2): 40 mg via ORAL
  Filled 2014-12-18 (×2): qty 1

## 2014-12-18 MED ORDER — SODIUM CHLORIDE 0.9 % IJ SOLN
INTRAMUSCULAR | Status: AC
  Filled 2014-12-18: qty 10

## 2014-12-18 MED ORDER — DIPHENHYDRAMINE HCL 12.5 MG/5ML PO ELIX: 12.5000 mg | ORAL_SOLUTION | ORAL | Status: DC | PRN

## 2014-12-18 MED ORDER — OXYCODONE HCL 5 MG PO TABS
ORAL_TABLET | ORAL | Status: AC
  Filled 2014-12-18: qty 1

## 2014-12-18 MED ORDER — ALUM & MAG HYDROXIDE-SIMETH 200-200-20 MG/5ML PO SUSP: 30.0000 mL | ORAL | Status: DC | PRN

## 2014-12-18 MED ORDER — VANCOMYCIN HCL IN DEXTROSE 1-5 GM/200ML-% IV SOLN
INTRAVENOUS | Status: AC
  Filled 2014-12-18: qty 200

## 2014-12-18 MED ORDER — METHOCARBAMOL 500 MG PO TABS
500.0000 mg | ORAL_TABLET | Freq: Four times a day (QID) | ORAL | Status: DC | PRN
  Administered 2014-12-18 – 2014-12-20 (×6): 500 mg via ORAL
  Filled 2014-12-18 (×6): qty 1

## 2014-12-18 MED ORDER — DAPAGLIFLOZIN PRO-METFORMIN ER 5-1000 MG PO TB24
1.0000 | ORAL_TABLET | Freq: Every day | ORAL | Status: DC
Start: 2014-12-18 — End: 2014-12-18

## 2014-12-18 MED ORDER — LIDOCAINE HCL (CARDIAC) 20 MG/ML IV SOLN
INTRAVENOUS | Status: AC
  Filled 2014-12-18: qty 5

## 2014-12-18 MED ORDER — CEFAZOLIN SODIUM 1-5 GM-% IV SOLN
1.0000 g | Freq: Four times a day (QID) | INTRAVENOUS | Status: AC
  Administered 2014-12-18 (×3): 1 g via INTRAVENOUS
  Filled 2014-12-18 (×3): qty 50

## 2014-12-18 MED ORDER — ONDANSETRON HCL 4 MG/2ML IJ SOLN
INTRAMUSCULAR | Status: DC | PRN
  Administered 2014-12-18: 4 mg via INTRAVENOUS

## 2014-12-18 MED ORDER — HYDROMORPHONE HCL 1 MG/ML IJ SOLN
1.0000 mg | INTRAMUSCULAR | Status: DC | PRN
  Administered 2014-12-18: 1 mg via INTRAVENOUS
  Filled 2014-12-18: qty 1

## 2014-12-18 MED ORDER — METHOCARBAMOL 1000 MG/10ML IJ SOLN
500.0000 mg | Freq: Four times a day (QID) | INTRAMUSCULAR | Status: DC | PRN
  Filled 2014-12-18: qty 5

## 2014-12-18 MED ORDER — PROPOFOL 10 MG/ML IV BOLUS
INTRAVENOUS | Status: AC
  Filled 2014-12-18: qty 20

## 2014-12-18 MED ORDER — HYDROMORPHONE HCL 1 MG/ML IJ SOLN
INTRAMUSCULAR | Status: AC
  Administered 2014-12-18: 0.5 mg via INTRAVENOUS
  Filled 2014-12-18: qty 1

## 2014-12-18 MED ORDER — ONDANSETRON HCL 4 MG/2ML IJ SOLN
INTRAMUSCULAR | Status: AC
  Filled 2014-12-18: qty 2

## 2014-12-18 MED ORDER — TRANEXAMIC ACID 1000 MG/10ML IV SOLN
1000.0000 mg | INTRAVENOUS | Status: AC
  Administered 2014-12-18: 1000 mg via INTRAVENOUS
  Filled 2014-12-18: qty 10

## 2014-12-18 MED ORDER — BUPIVACAINE HCL (PF) 0.5 % IJ SOLN
INTRAMUSCULAR | Status: DC | PRN
  Administered 2014-12-18: 25 mL via PERINEURAL

## 2014-12-18 MED ORDER — METOCLOPRAMIDE HCL 5 MG/ML IJ SOLN: 5.0000 mg | Freq: Three times a day (TID) | INTRAMUSCULAR | Status: DC | PRN

## 2014-12-18 MED ORDER — SODIUM CHLORIDE 0.9 % IV SOLN
2000.0000 mg | INTRAVENOUS | Status: AC
  Administered 2014-12-18: 2000 mg via TOPICAL
  Filled 2014-12-18: qty 20

## 2014-12-18 MED ORDER — PROPOFOL 10 MG/ML IV BOLUS
INTRAVENOUS | Status: DC | PRN
  Administered 2014-12-18: 200 mg via INTRAVENOUS
  Administered 2014-12-18 (×2): 50 mg via INTRAVENOUS

## 2014-12-18 MED ORDER — ASPIRIN EC 325 MG PO TBEC: 325.0000 mg | DELAYED_RELEASE_TABLET | Freq: Two times a day (BID) | ORAL | Status: DC

## 2014-12-18 MED ORDER — CANAGLIFLOZIN 100 MG PO TABS
100.0000 mg | ORAL_TABLET | Freq: Every day | ORAL | Status: DC
  Administered 2014-12-19 – 2014-12-20 (×2): 100 mg via ORAL
  Filled 2014-12-18 (×3): qty 1

## 2014-12-18 MED ORDER — LISINOPRIL 20 MG PO TABS
20.0000 mg | ORAL_TABLET | Freq: Every day | ORAL | Status: DC
  Administered 2014-12-18 – 2014-12-19 (×2): 20 mg via ORAL
  Filled 2014-12-18 (×3): qty 1

## 2014-12-18 MED ORDER — SENNOSIDES-DOCUSATE SODIUM 8.6-50 MG PO TABS: 1.0000 | ORAL_TABLET | Freq: Every evening | ORAL | Status: DC | PRN

## 2014-12-18 MED ORDER — VANCOMYCIN HCL 1000 MG IV SOLR
1000.0000 mg | INTRAVENOUS | Status: DC | PRN
  Administered 2014-12-18: 1000 mg via INTRAVENOUS

## 2014-12-18 MED ORDER — ACETAMINOPHEN 325 MG PO TABS: 650.0000 mg | ORAL_TABLET | Freq: Four times a day (QID) | ORAL | Status: DC | PRN

## 2014-12-18 MED ORDER — DOCUSATE SODIUM 100 MG PO CAPS
100.0000 mg | ORAL_CAPSULE | Freq: Two times a day (BID) | ORAL | Status: DC
  Administered 2014-12-18 – 2014-12-20 (×5): 100 mg via ORAL
  Filled 2014-12-18 (×5): qty 1

## 2014-12-18 MED ORDER — MIDAZOLAM HCL 5 MG/5ML IJ SOLN
INTRAMUSCULAR | Status: DC | PRN
  Administered 2014-12-18: 2 mg via INTRAVENOUS

## 2014-12-18 MED ORDER — HYDROMORPHONE HCL 1 MG/ML IJ SOLN
0.5000 mg | INTRAMUSCULAR | Status: DC | PRN
  Administered 2014-12-18: 0.5 mg via INTRAVENOUS

## 2014-12-18 MED ORDER — PANTOPRAZOLE SODIUM 40 MG PO TBEC
40.0000 mg | DELAYED_RELEASE_TABLET | Freq: Every day | ORAL | Status: DC
  Administered 2014-12-18 – 2014-12-20 (×3): 40 mg via ORAL
  Filled 2014-12-18 (×3): qty 1

## 2014-12-18 MED ORDER — METOCLOPRAMIDE HCL 5 MG PO TABS: 5.0000 mg | ORAL_TABLET | Freq: Three times a day (TID) | ORAL | Status: DC | PRN

## 2014-12-18 MED ORDER — PHENOL 1.4 % MT LIQD: 1.0000 | OROMUCOSAL | Status: DC | PRN

## 2014-12-18 MED ORDER — KCL IN DEXTROSE-NACL 20-5-0.45 MEQ/L-%-% IV SOLN
INTRAVENOUS | Status: DC
  Administered 2014-12-18 – 2014-12-19 (×3): via INTRAVENOUS
  Filled 2014-12-18 (×2): qty 1000

## 2014-12-18 MED ORDER — KCL IN DEXTROSE-NACL 20-5-0.45 MEQ/L-%-% IV SOLN
INTRAVENOUS | Status: AC
Start: 2014-12-18 — End: 2014-12-18
  Administered 2014-12-18: 1000 mL
  Filled 2014-12-18: qty 1000

## 2014-12-18 MED ORDER — OXYCODONE-ACETAMINOPHEN 5-325 MG PO TABS: 1.0000 | ORAL_TABLET | ORAL | Status: DC | PRN

## 2014-12-18 MED ORDER — LIDOCAINE-EPINEPHRINE (PF) 1.5 %-1:200000 IJ SOLN
INTRAMUSCULAR | Status: DC | PRN
  Administered 2014-12-18: 15 mL via PERINEURAL

## 2014-12-18 MED ORDER — BUPIVACAINE LIPOSOME 1.3 % IJ SUSP
20.0000 mL | INTRAMUSCULAR | Status: AC
  Administered 2014-12-18: 20 mL
  Filled 2014-12-18: qty 20

## 2014-12-18 MED ORDER — ONDANSETRON HCL 4 MG PO TABS: 4.0000 mg | ORAL_TABLET | Freq: Four times a day (QID) | ORAL | Status: DC | PRN

## 2014-12-18 MED ORDER — SUCCINYLCHOLINE CHLORIDE 20 MG/ML IJ SOLN
INTRAMUSCULAR | Status: AC
  Filled 2014-12-18: qty 1

## 2014-12-18 MED ORDER — TAMSULOSIN HCL 0.4 MG PO CAPS
0.4000 mg | ORAL_CAPSULE | Freq: Every day | ORAL | Status: DC
  Administered 2014-12-19: 0.4 mg via ORAL
  Filled 2014-12-18 (×2): qty 1

## 2014-12-18 MED ORDER — LACTATED RINGERS IV SOLN
INTRAVENOUS | Status: DC | PRN
  Administered 2014-12-18 (×2): via INTRAVENOUS

## 2014-12-18 MED ORDER — FENTANYL CITRATE (PF) 250 MCG/5ML IJ SOLN
INTRAMUSCULAR | Status: AC
  Filled 2014-12-18: qty 5

## 2014-12-18 MED ORDER — BUPROPION HCL ER (XL) 150 MG PO TB24
150.0000 mg | ORAL_TABLET | Freq: Every day | ORAL | Status: DC
  Administered 2014-12-18 – 2014-12-20 (×3): 150 mg via ORAL
  Filled 2014-12-18 (×3): qty 1

## 2014-12-18 MED ORDER — ASPIRIN EC 325 MG PO TBEC
325.0000 mg | DELAYED_RELEASE_TABLET | Freq: Every day | ORAL | Status: DC
  Administered 2014-12-19 – 2014-12-20 (×2): 325 mg via ORAL
  Filled 2014-12-18 (×2): qty 1

## 2014-12-18 SURGICAL SUPPLY — 65 items
BANDAGE ELASTIC 4 VELCRO ST LF (GAUZE/BANDAGES/DRESSINGS) ×3 IMPLANT
BANDAGE ELASTIC 6 VELCRO ST LF (GAUZE/BANDAGES/DRESSINGS) ×3 IMPLANT
BANDAGE ESMARK 6X9 LF (GAUZE/BANDAGES/DRESSINGS) ×1 IMPLANT
BLADE SAG 18X100X1.27 (BLADE) ×3 IMPLANT
BLADE SAW SAG 90X13X1.27 (BLADE) ×3 IMPLANT
BLADE SAW SGTL 81X20 HD (BLADE) ×3 IMPLANT
BLADE SURG ROTATE 9660 (MISCELLANEOUS) IMPLANT
BNDG ELASTIC 6X10 VLCR STRL LF (GAUZE/BANDAGES/DRESSINGS) ×3 IMPLANT
BNDG ESMARK 6X9 LF (GAUZE/BANDAGES/DRESSINGS) ×3
BOWL SMART MIX CTS (DISPOSABLE) ×3 IMPLANT
CAPT KNEE TOTAL 3 ATTUNE ×3 IMPLANT
CEMENT HV SMART SET (Cement) ×6 IMPLANT
COVER BACK TABLE 24X17X13 BIG (DRAPES) IMPLANT
COVER SURGICAL LIGHT HANDLE (MISCELLANEOUS) ×3 IMPLANT
CUFF TOURNIQUET SINGLE 34IN LL (TOURNIQUET CUFF) ×3 IMPLANT
CUFF TOURNIQUET SINGLE 44IN (TOURNIQUET CUFF) IMPLANT
DISC DIAMOND MED (BURR) IMPLANT
DRAPE EXTREMITY T 121X128X90 (DRAPE) ×3 IMPLANT
DRAPE IMP U-DRAPE 54X76 (DRAPES) ×3 IMPLANT
DRAPE U-SHAPE 47X51 STRL (DRAPES) ×3 IMPLANT
DURAPREP 26ML APPLICATOR (WOUND CARE) ×3 IMPLANT
ELECT REM PT RETURN 9FT ADLT (ELECTROSURGICAL) ×3
ELECTRODE REM PT RTRN 9FT ADLT (ELECTROSURGICAL) ×1 IMPLANT
EVACUATOR 1/8 PVC DRAIN (DRAIN) IMPLANT
GAUZE SPONGE 4X4 12PLY STRL (GAUZE/BANDAGES/DRESSINGS) ×3 IMPLANT
GAUZE XEROFORM 1X8 LF (GAUZE/BANDAGES/DRESSINGS) ×3 IMPLANT
GLOVE BIO SURGEON STRL SZ7.5 (GLOVE) ×3 IMPLANT
GLOVE BIO SURGEON STRL SZ8.5 (GLOVE) ×6 IMPLANT
GLOVE BIOGEL PI IND STRL 8 (GLOVE) ×2 IMPLANT
GLOVE BIOGEL PI IND STRL 9 (GLOVE) ×1 IMPLANT
GLOVE BIOGEL PI INDICATOR 8 (GLOVE) ×4
GLOVE BIOGEL PI INDICATOR 9 (GLOVE) ×2
GOWN STRL REUS W/ TWL LRG LVL3 (GOWN DISPOSABLE) ×1 IMPLANT
GOWN STRL REUS W/ TWL XL LVL3 (GOWN DISPOSABLE) ×3 IMPLANT
GOWN STRL REUS W/TWL LRG LVL3 (GOWN DISPOSABLE) ×2
GOWN STRL REUS W/TWL XL LVL3 (GOWN DISPOSABLE) ×6
HANDPIECE INTERPULSE COAX TIP (DISPOSABLE) ×2
HOOD PEEL AWAY FACE SHEILD DIS (HOOD) ×9 IMPLANT
KIT BASIN OR (CUSTOM PROCEDURE TRAY) ×3 IMPLANT
KIT ROOM TURNOVER OR (KITS) ×3 IMPLANT
MANIFOLD NEPTUNE II (INSTRUMENTS) ×3 IMPLANT
NEEDLE SPNL 18GX3.5 QUINCKE PK (NEEDLE) ×3 IMPLANT
NS IRRIG 1000ML POUR BTL (IV SOLUTION) ×3 IMPLANT
PACK TOTAL JOINT (CUSTOM PROCEDURE TRAY) ×3 IMPLANT
PACK UNIVERSAL I (CUSTOM PROCEDURE TRAY) ×3 IMPLANT
PAD ARMBOARD 7.5X6 YLW CONV (MISCELLANEOUS) ×6 IMPLANT
PAD CAST 4YDX4 CTTN HI CHSV (CAST SUPPLIES) ×1 IMPLANT
PADDING CAST COTTON 4X4 STRL (CAST SUPPLIES) ×2
PADDING CAST COTTON 6X4 STRL (CAST SUPPLIES) ×3 IMPLANT
RASP HELIOCORDIAL MED (MISCELLANEOUS) IMPLANT
SET HNDPC FAN SPRY TIP SCT (DISPOSABLE) ×1 IMPLANT
STAPLER VISISTAT 35W (STAPLE) ×3 IMPLANT
SUCTION FRAZIER TIP 10 FR DISP (SUCTIONS) ×3 IMPLANT
SUT VIC AB 0 CT1 27 (SUTURE) ×2
SUT VIC AB 0 CT1 27XBRD ANBCTR (SUTURE) ×1 IMPLANT
SUT VIC AB 1 CTX 36 (SUTURE) ×2
SUT VIC AB 1 CTX36XBRD ANBCTR (SUTURE) ×1 IMPLANT
SUT VIC AB 2-0 CT1 27 (SUTURE) ×2
SUT VIC AB 2-0 CT1 TAPERPNT 27 (SUTURE) ×1 IMPLANT
SYR 50ML LL SCALE MARK (SYRINGE) ×3 IMPLANT
TOWEL OR 17X24 6PK STRL BLUE (TOWEL DISPOSABLE) ×3 IMPLANT
TOWEL OR 17X26 10 PK STRL BLUE (TOWEL DISPOSABLE) ×3 IMPLANT
TRAY FOLEY CATH 14FR (SET/KITS/TRAYS/PACK) IMPLANT
TUBE ANAEROBIC SPECIMEN COL (MISCELLANEOUS) IMPLANT
WATER STERILE IRR 1000ML POUR (IV SOLUTION) ×9 IMPLANT

## 2014-12-18 NOTE — Progress Notes (Signed)
Orthopedic Tech Progress Note Patient Details:  Johnathan Barr 03/08/1960 [id number] On cpm at 6:40 pm Patient ID: Johnathan Barr, male   DOB: 06/19/1960, 54 y.o.   MRN: [id number]   Johnathan Barr, Johnathan Barr 12/18/2014, 6:40 PM

## 2014-12-18 NOTE — Op Note (Signed)
PATIENT ID:      Johnathan BaltimoreJohn E Barr  MRN:     [id number] DOB/AGE:    06/03/1960 / 54 y.o.       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   Weight as of this encounter: 112.492 kg (248 lb).                                                        POSTOPERATIVE DIAGNOSIS:   PAINFUL UNI TOTAL KNEE ARTHROPLASTY POSSIBLE INFECTION                                                                      PROCEDURE:  Procedure(s): REMOVAL LEFT UNI KNEE COMPONENTS/LEFT TOTAL KNEE ARTHROPLASTY  Using DepuyAttune RP implants #7L Femur, #8Tibia, 10 mm Attune RP bearing, 41 Patella     SURGEON: Johnathan Barr J    ASSISTANT:   Johnathan K. [employer] PA-C   (Present and scrubbed throughout the case, critical for assistance with exposure, retraction, instrumentation, and closure.)         ANESTHESIA: GET, Exparel  EBL: 600  FLUID REPLACEMENT: 1800 crystalloid  TOURNIQUET TIME: 15min  Drains: None  Tranexamic Acid: 1gm iv, 2gm topical   COMPLICATIONS:  None         INDICATIONS FOR PROCEDURE: The patient has  Patient had primary medial unicompartmental knee in July 2015, unfortunately one on to become infected with pus in the knee although all cultures were negative. He underwent irrigation debridement with trade out of the polyethylene bearing. He received IV antibiotics for 6 weeks and then oral antibiotics for over 3 months. His sedimentation rate went from 100 to normal and he is been off antibiotics now for the last few months. Recent x-rays of shown loss of cartilage to the patellofemoral and lateral compartments. He is once again having pain with some intermittent swelling. Postoperative aspirates of the knee off of antibiotics have been negative for organisms. Pain is interfering with  activities at home wakes cement night, he is able to do his job at [employer].. Patient has failed all conservative measures including anti-inflammatory medicines, narcotics, attempts at exercise.  Risks and benefits of surgery have been discussed, questions answered. Intraoperatively we will obtain fluid and tissue looking for evidence of infection and if there is we will place antibiotic spacers. As surgery we will use antibiotic loaded cement for his total knee.  DESCRIPTION OF PROCEDURE: The patient identified by armband, IV antibiotics were held until we got specimens during surgery., [facility] Patient taken to the operating room, appropriate anesthetic  monitors were attached, and general endotracheal anesthesia induced with  the patient in supine position. Tourniquet  applied high to the operative thigh. Lateral post and foot positioner  applied to the table, the  lower extremity was then prepped and draped  in usual sterile fashion from the ankle to the tourniquet. Time-out procedure was performed. We began the operation, with the knee flexed 120 degrees, by making the anterior midline incision starting at handbreadth above the patella following the old midline scar and removing a few millimeters of skin and scar tissue until we were 1 cm medial to and 4 cm distal to the tibial tubercle. Small bleeders in the skin and the  subcutaneous tissue identified and cauterized. Transverse retinaculum was incised and reflected medially and a medial parapatellar arthrotomy was accomplished. the patella was everted and theprepatellar fat pad resected. Medially there was quite a bit of thickened tissue consistent with scar. The joint fluid itself was yellow and relatively clear. Specimens of the joint fluid were sent for stat Gram stain and cultures. The superficial medial collateral  ligament was then elevated from anterior to posterior along the proximal  flare of the tibia and  a during this process more scar tissue was removed medially.. The knee was hyperflexed exposing the unicompartmental knee prosthesis. At this point were able to extract the tibial bearing. Peripheral and notch osteophytes as well as the cruciate ligaments were then resected. We continued to  work our way around posteriorly along the proximal tibia, and externally  rotated the tibia subluxing it out from underneath the femur. A McHale  retractor was placed through the notch and a lateral Photographer. Using a quarter inch osteotome we then removed the femoral component breaking the interface between the medial femoral condyle and the unicompartmental knee prosthesis. The bone underneath was in relatively good condition. A small oscillating saw was then used to break the interface between the tibial component and cement going from anterior to medial towards posterior and then using a 1/2 inch wide osteotome the tibial prosthesis was removed and again the bone underneath was in relatively good condition. Using curettes and Rodgers remaining cement was removed from the bone. We then remove the tibial spines with a power saw. We then drilled through the proximal tibia in line with the  axis of the tibia followed by an intramedullary guide rod and 2-degree  posterior slope cutting guide. The tibial cutting guide, 3 degree posterior sloped, was pinned into place allowing resection of 1 mm of bone medially and about 10 mm of bone laterally. Satisfied with the tibial resection, we then  entered the distal femur 2 mm anterior to the PCL origin with the  intramedullary guide rod and applied the distal femoral cutting guide  set at 9mm, with 5 degrees of valgus. This was pinned along the  epicondylar axis. At this point, the distal femoral cut was accomplished without difficulty. We then sized for a #7L femoral component, with a posterior referenced sizing guide. To compensate for the missing posterior  medial femoral condyle the guide was held 5 mm off the remaining bone. This gave a symmetric flexion gaps..The chamfer cutting guide was pinned into place. The anterior, posterior, and chamfer cuts were accomplished without difficulty followed by  the Attune RP box cutting guide and the box cut. We also removed posterior osteophytes from the posterior femoral condyles, as well as more scar tissue. At this  time, the knee was brought into full extension. We checked our  extension and flexion gaps and found them symmetric for a 10 mm bearing. Distracting in extension with a lamina spreader, the posterior horns of the menisci were removed, and Exparel, diluted to  60 cc, was injected into the capsule of the knee. The  posterior patella cut was accomplished with the 9.5 mm Attune cutting guide, sized at 41 dome, and the fixation pegs drilled.The knee  was then once again hyperflexed exposing the proximal tibia. We sized for a #8 tibial base plate, applied the smokestack and the conical reamer, a small oscillating saw was used to remove cement that would interfere with the delta keel followed by the the Delta fin keel punch. We then hammered into place the Attune RP trial femoral component, inserted a  10 mm trial bearing, trial patellar button, and took the knee through range of motion from 0-130 degrees. No thumb pressure was required for patellar Tracking.  At this point, the limb was wrapped with an Esmarch bandage and the tourniquet inflated to 350 mmHg. All trial components were removed, mating surfaces irrigated with pulse lavage, and dried with suction and sponges. A double batch of DePuy HV cement with 1500 mg of Zinacef was mixed and applied to all bony metallic mating surfaces except for the posterior condyles of the femur itself. In order, we  hammered into place the tibial tray and removed excess cement, the femoral component and removed excess cement,  The 10mm  Attune RP bearing  was inserted, and  the knee brought to full extension with compression.  The patellar button was clamped into place, and excess cement  removed. While the cement cured the wound was irrigated out with normal saline solution pulse lavage. Ligament stability and patellar tracking were checked and found to be excellent. The parapatellar arthrotomy was closed with running #1 Vicryl suture. The subcutaneous tissue with 0 and 2-0 undyed  Vicryl suture, and the skin with running 3-0 SQ vicryl. A dressing of Xeroform,  4 x 4, dressing sponges, Webril, and Ace wrap applied. The patient  awakened, extubated, and taken to recovery room without difficulty.   Shanyce Daris J 12/18/2014, 9:48 AM

## 2014-12-18 NOTE — Anesthesia Procedure Notes (Addendum)
Anesthesia Regional Block:  Femoral nerve block  Pre-Anesthetic Checklist: ,, timeout performed, Correct Patient, Correct Site, Correct Laterality, Correct Procedure, Correct Position, site marked, Risks and benefits discussed,  Surgical consent,  Pre-op evaluation,  At surgeon's request and post-op pain management  Laterality: Left  Prep: chloraprep       Needles:  Injection technique: Single-shot  Needle Type: Echogenic Needle     Needle Length: 9cm 9 cm Needle Gauge: 21 and 21 G    Additional Needles:  Procedures: ultrasound guided (picture in chart) Femoral nerve block Narrative:  Injection made incrementally with aspirations every 5 mL.  Performed by: Personally   Additional Notes: Patient tolerated the procedure well without complications   Procedure Name: LMA Insertion Date/Time: 12/18/2014 7:36 AM Performed by: Rogelia BogaMUELLER, Arsal Tappan P Pre-anesthesia Checklist: Patient identified, Emergency Drugs available, Suction available, Patient being monitored and Timeout performed Patient Re-evaluated:Patient Re-evaluated prior to inductionOxygen Delivery Method: Circle system utilized Preoxygenation: Pre-oxygenation with 100% oxygen Intubation Type: IV induction LMA: LMA inserted LMA Size: 5.0 Number of attempts: 1 Placement Confirmation: positive ETCO2 and breath sounds checked- equal and bilateral Tube secured with: Tape Dental Injury: Teeth and Oropharynx as per pre-operative assessment

## 2014-12-18 NOTE — Progress Notes (Signed)
Orthopedic Tech Progress Note Patient Details:  Patsy BaltimoreJohn E Ringgold 05/05/1960 [id number]  CPM Left Knee CPM Left Knee: On Left Knee Flexion (Degrees): 40 Left Knee Extension (Degrees): 0 Additional Comments: trapeze bar patient helper Viewed order from doctor's order list  Nikki DomCrawford, Shadonna Benedick 12/18/2014, 10:44 AM

## 2014-12-18 NOTE — Discharge Instructions (Signed)

## 2014-12-18 NOTE — Anesthesia Postprocedure Evaluation (Signed)
  Anesthesia Post-op Note  Patient: Johnathan BaltimoreJohn E Hone  Procedure(s) Performed: Procedure(s) (LRB): REMOVAL LEFT UNI KNEE COMPONENTS/LEFT TOTAL KNEE ARTHROPLASTY  (Left)  Patient Location: PACU  Anesthesia Type: GA combined with regional for post-op pain  Level of Consciousness: awake and alert   Airway and Oxygen Therapy: Patient Spontanous Breathing  Post-op Pain: mild  Post-op Assessment: Post-op Vital signs reviewed, Patient's Cardiovascular Status Stable, Respiratory Function Stable, Patent Airway and No signs of Nausea or vomiting  Last Vitals:  Filed Vitals:   12/18/14 1045  BP: 138/90  Pulse: 75  Temp:   Resp: 10    Post-op Vital Signs: stable   Complications: No apparent anesthesia complications

## 2014-12-18 NOTE — Evaluation (Signed)
Physical Therapy Evaluation Patient Details Name: Johnathan BaltimoreJohn E Barr MRN: [id number] DOB: 08/30/1960 Today's Date: 12/18/2014   History of Present Illness  54 yo admitted for transition of Left unicompartmental knee (09/16/13)  to left TKA (12/18/14). PMHx of DM, HTN, HLD  Clinical Impression  Pt very pleasant and willing to mobilize. Pt with lightheadedness EOB which resolved after 5 min EOB. Pt with increased drainage with leg dependent bleeding through dressing with RN notified and present to reinforce end of session. Pt limited by pain and decreased LLE quad strength today with education for precaution, plan, transfers and gait. Pt will benefit from acute therapy to maximize mobility, ROM and strength to decrease burden of care and return pt to PLOF.     Follow Up Recommendations Home health PT    Equipment Recommendations  None recommended by PT    Recommendations for Other Services       Precautions / Restrictions Precautions Precautions: Knee Restrictions Weight Bearing Restrictions: Yes LLE Weight Bearing: Weight bearing as tolerated      Mobility  Bed Mobility Overal bed mobility: Modified Independent             General bed mobility comments: pt utilizing RLE to assist LLE without cues and min use of rail to pivot to right side of bed  Transfers Overall transfer level: Needs assistance   Transfers: Sit to/from Stand Sit to Stand: Min assist         General transfer comment: cues for hand and LLE placement, sequence and safety  Ambulation/Gait Ambulation/Gait assistance: Min guard Ambulation Distance (Feet): 8 Feet Assistive device: Rolling walker (2 wheeled) Gait Pattern/deviations: Step-to pattern;Decreased stride length;Decreased stance time - left;Decreased weight shift to left   Gait velocity interpretation: Below normal speed for age/gender General Gait Details: pt with difficulty maintaining quad contraction of LLE in standing and reports buckling at  baseline PTA. Cues for safety, sequence and RW use  Stairs            Wheelchair Mobility    Modified Rankin (Stroke Patients Only)       Balance Overall balance assessment: Needs assistance   Sitting balance-Leahy Scale: Good       Standing balance-Leahy Scale: Poor                               Pertinent Vitals/Pain Pain Assessment: 0-10 Pain Score: 5  Pain Location: left knee and posterior thigh Pain Descriptors / Indicators: Aching Pain Intervention(s): Repositioned;Limited activity within patient's tolerance;Monitored during session;RN gave pain meds during session;Ice applied  91% on RA, HR 85, RN requested return to 2L [place] end of session    Home Living Family/patient expects to be discharged to:: Private residence Living Arrangements: Children Available Help at Discharge: Available PRN/intermittently;Family Type of Home: House Home Access: Stairs to enter Entrance Stairs-Rails: None Entrance Stairs-Number of Steps: 3 Home Layout: One level Home Equipment: Walker - 2 wheels;Cane - single point;Crutches      Prior Function Level of Independence: Independent               Hand Dominance        Extremity/Trunk Assessment   Upper Extremity Assessment: Overall WFL for tasks assessed           Lower Extremity Assessment: LLE deficits/detail   LLE Deficits / Details: decreased strength and ROM as expected post-op  Cervical / Trunk Assessment: Normal  Communication   Communication:  No difficulties  Cognition Arousal/Alertness: Awake/alert Behavior During Therapy: WFL for tasks assessed/performed Overall Cognitive Status: Within Functional Limits for tasks assessed                      General Comments      Exercises Total Joint Exercises Ankle Circles/Pumps: AROM;Seated;Left;10 reps Long Arc Quad: AAROM;Seated;Left;5 reps      Assessment/Plan    PT Assessment Patient needs continued PT services  PT  Diagnosis Difficulty walking;Acute pain   PT Problem List Decreased strength;Decreased range of motion;Decreased activity tolerance;Decreased balance;Decreased mobility;Pain;Decreased knowledge of use of DME  PT Treatment Interventions DME instruction;Gait training;Stair training;Functional mobility training;Therapeutic activities;Therapeutic exercise;Patient/family education   PT Goals (Current goals can be found in the Care Plan section) Acute Rehab PT Goals Patient Stated Goal: return to bicycling PT Goal Formulation: With patient Time For Goal Achievement: 01/01/15 Potential to Achieve Goals: Good    Frequency 7X/week   Barriers to discharge Decreased caregiver support      Co-evaluation               End of Session Equipment Utilized During Treatment: Gait belt Activity Tolerance: Patient tolerated treatment well Patient left: in chair;with call bell/phone within reach;with chair alarm set;with nursing/sitter in room Nurse Communication: Mobility status;Precautions;Weight bearing status         Time: 1096-0454 PT Time Calculation (min) (ACUTE ONLY): 27 min   Charges:   PT Evaluation $Initial PT Evaluation Tier I: 1 Procedure PT Treatments $Therapeutic Activity: 8-22 mins   PT G CodesDelorse Lek 12/18/2014, 1:36 PM Delaney Meigs, PT [phone]

## 2014-12-18 NOTE — Transfer of Care (Signed)
Immediate Anesthesia Transfer of Care Note  Patient: Johnathan BaltimoreJohn E Kleven  Procedure(s) Performed: Procedure(s): REMOVAL LEFT UNI KNEE COMPONENTS/LEFT TOTAL KNEE ARTHROPLASTY  (Left)  Patient Location: PACU  Anesthesia Type:GA combined with regional for post-op pain  Level of Consciousness: awake, alert , oriented and patient cooperative  Airway & Oxygen Therapy: Patient Spontanous Breathing and Patient connected to nasal cannula oxygen  Post-op Assessment: Report given to RN, Post -op Vital signs reviewed and stable and Patient moving all extremities  Post vital signs: Reviewed and stable  Last Vitals:  Filed Vitals:   12/18/14 0551  BP: 134/84  Pulse: 79  Temp: 37.3 C  Resp: 18    Complications: No apparent anesthesia complications

## 2014-12-18 NOTE — Care Management (Signed)
Utilization review completed. Lavone Weisel, RN Case Manager [phone]. 

## 2014-12-18 NOTE — Interval H&P Note (Signed)
History and Physical Interval Note:  12/18/2014 7:13 AM  Johnathan BaltimoreJohn E Westry  has presented today for surgery, with the diagnosis of LOOSE PAINFUL UNI TKA POSSIBLE INFECTION  The various methods of treatment have been discussed with the patient and family. After consideration of risks, benefits and other options for treatment, the patient has consented to  Procedure(s): TOTAL KNEE ARTHROPLASTY WITH REVISION COMPONENTS (Left) as a surgical intervention .  The patient's history has been reviewed, patient examined, no change in status, stable for surgery.  I have reviewed the patient's chart and labs.  Questions were answered to the patient's satisfaction.     Nestor LewandowskyOWAN,Cher Egnor J

## 2014-12-18 NOTE — Anesthesia Preprocedure Evaluation (Addendum)
Anesthesia Evaluation  Patient identified by MRN, date of birth, ID band Patient awake    Reviewed: Allergy & Precautions, NPO status , Patient's Chart, lab work & pertinent test results  Airway Mallampati: II  TM Distance: >3 FB Neck ROM: Full    Dental no notable dental hx. (+) Teeth Intact, Dental Advisory Given   Pulmonary neg pulmonary ROS, Current Smoker,    Pulmonary exam normal breath sounds clear to auscultation       Cardiovascular hypertension, Pt. on medications Normal cardiovascular exam Rhythm:Regular Rate:Normal     Neuro/Psych negative neurological ROS  negative psych ROS   GI/Hepatic negative GI ROS, Neg liver ROS,   Endo/Other  diabetes, Type 2, Oral Hypoglycemic AgentsHyperthyroidism   Renal/GU negative Renal ROS  negative genitourinary   Musculoskeletal negative musculoskeletal ROS (+) Arthritis , Osteoarthritis,    Abdominal   Peds negative pediatric ROS (+)  Hematology negative hematology ROS (+)   Anesthesia Other Findings   Reproductive/Obstetrics negative OB ROS                            Anesthesia Physical Anesthesia Plan  ASA: II  Anesthesia Plan: General   Post-op Pain Management: GA combined w/ Regional for post-op pain   Induction: Intravenous  Airway Management Planned: LMA and Oral ETT  Additional Equipment:   Intra-op Plan:   Post-operative Plan: Extubation in OR  Informed Consent: I have reviewed the patients History and Physical, chart, labs and discussed the procedure including the risks, benefits and alternatives for the proposed anesthesia with the patient or authorized representative who has indicated his/her understanding and acceptance.   Dental advisory given  Plan Discussed with: CRNA, Surgeon and Anesthesiologist  Anesthesia Plan Comments:        Anesthesia Quick Evaluation

## 2014-12-19 ENCOUNTER — Encounter (HOSPITAL_COMMUNITY): Payer: Self-pay | Admitting: Orthopedic Surgery

## 2014-12-19 LAB — BASIC METABOLIC PANEL
Anion gap: 6 (ref 5–15)
BUN: 11 mg/dL (ref 6–20)
CHLORIDE: 97 mmol/L — AB (ref 101–111)
CO2: 28 mmol/L (ref 22–32)
Calcium: 8.1 mg/dL — ABNORMAL LOW (ref 8.9–10.3)
Creatinine, Ser: 1.14 mg/dL (ref 0.61–1.24)
Glucose, Bld: 149 mg/dL — ABNORMAL HIGH (ref 65–99)
POTASSIUM: 3.8 mmol/L (ref 3.5–5.1)
SODIUM: 131 mmol/L — AB (ref 135–145)

## 2014-12-19 LAB — CBC
HCT: 32.4 % — ABNORMAL LOW (ref 39.0–52.0)
HEMOGLOBIN: 10.6 g/dL — AB (ref 13.0–17.0)
MCH: 30.3 pg (ref 26.0–34.0)
MCHC: 32.7 g/dL (ref 30.0–36.0)
MCV: 92.6 fL (ref 78.0–100.0)
PLATELETS: 214 10*3/uL (ref 150–400)
RBC: 3.5 MIL/uL — AB (ref 4.22–5.81)
RDW: 13.1 % (ref 11.5–15.5)
WBC: 8.3 10*3/uL (ref 4.0–10.5)

## 2014-12-19 NOTE — Evaluation (Signed)
Occupational Therapy Evaluation & Discharge Patient Details Name: Johnathan BaltimoreJohn E Roots MRN: [id number] DOB: 10/20/1960 Today's Date: 12/19/2014    History of Present Illness 54 yo admitted for transition of Left unicompartmental knee (09/16/13)  to left TKA (12/18/14). PMHx of DM, HTN, HLD   Clinical Impression   Pt admitted to hospital due to reason stated above. Pt currently with functional limitiations due to the deficits listed below (see OT problem list). Prior to admission pt was independent with ADLs. Pt currently requires supervision to minimal assistance for safety with ADLs. Pt reports daughter will be available to provide assistance on an as needed basis. All education complete, although therapist feels pt could benefit from OT to address safety awareness deficits and assist with increasing independence with LB dressing, pt politely decline the continuation of skilled OT services. Pt will benefit from 24 hour supervision to assist with maximizing safety with ADLs and balance to allow pt to remain safe at home.    Follow Up Recommendations  Supervision/Assistance - 24 hour    Equipment Recommendations  None recommended by OT    Recommendations for Other Services       Precautions / Restrictions Precautions Precautions: Knee Precaution Booklet Issued: No Restrictions Weight Bearing Restrictions: Yes LLE Weight Bearing: Weight bearing as tolerated      Mobility Bed Mobility Overal bed mobility: Modified Independent             General bed mobility comments: Pt in chair upon arrival  Transfers Overall transfer level: Needs assistance Equipment used: Rolling walker (2 wheeled) Transfers: Sit to/from Stand Sit to Stand: Min guard         General transfer comment: cues for hand and LLE placement, sequence and safety    Balance Overall balance assessment: Needs assistance Sitting-balance support: No upper extremity supported;Feet supported Sitting balance-Leahy  Scale: Good     Standing balance support: No upper extremity supported;During functional activity Standing balance-Leahy Scale: Fair Standing balance comment: Pt able to stand at sink to perform grooming task with no UE support                            ADL Overall ADL's : Needs assistance/impaired Eating/Feeding: Independent;Sitting   Grooming: Wash/dry face;Oral care;Min guard;Standing   Upper Body Bathing: Supervision/ safety;Sitting   Lower Body Bathing: Minimal assistance;Sitting/lateral leans;Sit to/from stand   Upper Body Dressing : Supervision/safety;Sitting   Lower Body Dressing: Minimal assistance;Sitting/lateral leans;Sit to/from stand Lower Body Dressing Details (indicate cue type and reason): required assistance with doffing sock and donning bedroom shoes, verbal cues for safety with LLE Toilet Transfer: Supervision/safety;Ambulation;RW;Comfort height toilet   Toileting- Clothing Manipulation and Hygiene: Supervision/safety;Sitting/lateral lean;Sit to/from Nurse, children'sstand     Tub/Shower Transfer Details (indicate cue type and reason): educated in shower transfer using rolling walker and simulated shower seat, pt able to return safe demonstration Functional mobility during ADLs: Min guard;Rolling walker General ADL Comments: Pt provided adaptive equipment education for LB dressing and bathing.     Vision     Perception Perception Perception Tested?: No   Praxis      Pertinent Vitals/Pain Pain Assessment: 0-10 Pain Score: 3  Pain Location: entire left leg Pain Descriptors / Indicators: Aching Pain Intervention(s): Monitored during session;Repositioned     Hand Dominance Right   Extremity/Trunk Assessment Upper Extremity Assessment Upper Extremity Assessment: Overall WFL for tasks assessed   Lower Extremity Assessment Lower Extremity Assessment: Defer to PT evaluation   Cervical /  Trunk Assessment Cervical / Trunk Assessment: Normal    Communication Communication Communication: No difficulties   Cognition Arousal/Alertness: Awake/alert Behavior During Therapy: WFL for tasks assessed/performed;Impulsive Overall Cognitive Status: Within Functional Limits for tasks assessed Area of Impairment: Safety/judgement;Awareness         Safety/Judgement: Decreased awareness of safety     General Comments: Pt displayed deficits in safety awareness by trying to don Lt LE bedroom shoe while standing and bending over   General Comments    Pt educated in fall prevention for home safety and adaptive equipment for LB dressing.    Exercises       Shoulder Instructions      Home Living Family/patient expects to be discharged to:: Private residence Living Arrangements: Children Available Help at Discharge: Family;Available PRN/intermittently Type of Home: House Home Access: Stairs to enter [employer] of Steps: 3 Entrance Stairs-Rails: Can reach both Home Layout: Multi-level;Able to live on main level with bedroom/bathroom Alternate Level Stairs-Number of Steps: 2 flights Alternate Level Stairs-Rails: None Bathroom Shower/Tub: Producer, television/film/video: Standard Bathroom Accessibility: Yes How Accessible: Accessible via walker Home Equipment: Walker - 2 wheels;Cane - single point;Crutches;Shower seat          Prior Functioning/Environment Level of Independence: Independent             OT Diagnosis: Generalized weakness;Acute pain   OT Problem List: Decreased activity tolerance;Impaired balance (sitting and/or standing);Decreased knowledge of use of DME or AE;Decreased knowledge of precautions;Pain;Decreased safety awareness   OT Treatment/Interventions:      OT Goals(Current goals can be found in the care plan section) Acute Rehab OT Goals Patient Stated Goal: go home  OT Frequency:     Barriers to D/C:            Co-evaluation              End of Session Equipment Utilized  During Treatment: Gait belt;Rolling walker CPM Left Knee CPM Left Knee: Off Nurse Communication: Other (comment) (surgery site drainage)  Activity Tolerance: Patient tolerated treatment well Patient left: in chair;with call bell/phone within reach   Time: 1914-7829 OT Time Calculation (min): 24 min Charges:  OT General Charges $OT Visit: 1 Procedure OT Evaluation $Initial OT Evaluation Tier I: 1 Procedure G-Codes:    Smiley Houseman 2015/01/18, 11:41 AM

## 2014-12-19 NOTE — Progress Notes (Addendum)
Orthopedic Tech Progress Note Patient Details:  Johnathan BaltimoreJohn E Barr 02/24/1961 [id number] On cpm at 6:45 pm Patient ID: Johnathan Barr, male   DOB: 09/04/1960, 54 y.o.   MRN: [id number]   Johnathan Barr, Johnathan Barr 12/19/2014, 6:43 PM

## 2014-12-19 NOTE — Progress Notes (Signed)
Patient ID: Johnathan Barr, male   DOB: 07/07/1960, 54 y.o.   MRN: [id number] PATIENT ID: Johnathan Barr  MRN: [id number]  DOB/AGE:  06/25/1960 / 54 y.o.  1 Day Post-Op Procedure(s) (LRB): REMOVAL LEFT UNI KNEE COMPONENTS/LEFT TOTAL KNEE ARTHROPLASTY  (Left)    PROGRESS NOTE Subjective: Patient is alert, oriented, no Nausea, no Vomiting, yes passing gas, no Bowel Movement. Taking PO well. Denies SOB, Chest or Calf Pain. Using Incentive Spirometer, PAS in place. Ambulate WBAT, CPM 0-90 Patient reports pain as 3 on 0-10 scale  .    Objective: Vital signs in last 24 hours: Filed Vitals:   12/18/14 1332 12/18/14 2050 12/19/14 0142 12/19/14 0450  BP:  89/49 99/51 95/61   Pulse: 85 87 92 116  Temp:  100.2 F (37.9 C) 100.3 F (37.9 C) 100.1 F (37.8 C)  TempSrc:  Oral Oral Oral  Resp:  20 20 20   Height:      Weight:      SpO2: 91% 97% 95% 95%      Intake/Output from previous day: I/O last 3 completed shifts: In: 2020 [P.O.:720; I.V.:1300] Out: 1625 [Urine:325; Blood:1300]   Intake/Output this shift:     LABORATORY DATA:  Recent Labs  12/18/14 0611 12/18/14 1027 12/19/14 0558  WBC  --   --  8.3  HGB  --   --  10.6*  HCT  --   --  32.4*  PLT  --   --  214  NA  --   --  131*  K  --   --  3.8  CL  --   --  97*  CO2  --   --  28  BUN  --   --  11  CREATININE  --   --  1.14  GLUCOSE  --   --  149*  GLUCAP 110* 140*  --   CALCIUM  --   --  8.1*    Examination: Neurologically intact ABD soft Neurovascular intact Sensation intact distally Intact pulses distally Dorsiflexion/Plantar flexion intact Incision: moderate drainage No cellulitis present Compartment soft} Dressing changed no active bleeding Assessment:   1 Day Post-Op Procedure(s) (LRB): REMOVAL LEFT UNI KNEE COMPONENTS/LEFT TOTAL KNEE ARTHROPLASTY  (Left) ADDITIONAL DIAGNOSIS: Expected Acute Blood Loss Anemia, Diabetes and Hypertension  Plan: PT/OT WBAT, CPM 5/hrs day until ROM 0-90 degrees, then D/C  CPM DVT Prophylaxis:  SCDx72hrs, ASA 325 mg BID x 2 weeks DISCHARGE PLAN: Home DISCHARGE NEEDS: HHPT, Walker and 3-in-1 comode seat     Timmie Calix J 12/19/2014, 8:03 AM

## 2014-12-19 NOTE — Progress Notes (Signed)
Physical Therapy Treatment Patient Details Name: Johnathan Barr MRN: [id number] DOB: [birth year] Today's Date: 12/19/2014    History of Present Illness 54 yo admitted for transition of Left unicompartmental knee (09/16/13)  to left TKA (12/18/14). PMHx of DM, HTN, HLD    PT Comments    Pt moving well but somewhat impatient and impulsive. He was donning CPM on arrival rather than waiting for staff assist. Pt educated for need for assist and how to call for help. Pt with continued bleeding through his dressing with RN present and reinforced during session. Pt educated for HEP and CPM and placed in CPM end of session.   Follow Up Recommendations  Home health PT     Equipment Recommendations       Recommendations for Other Services       Precautions / Restrictions Precautions Precautions: Knee Precaution Booklet Issued: No Restrictions Weight Bearing Restrictions: Yes LLE Weight Bearing: Weight bearing as tolerated    Mobility  Bed Mobility Overal bed mobility: Modified Independent              Transfers Overall transfer level: Needs assistance Equipment used: Rolling walker (2 wheeled)   Sit to Stand: Supervision         General transfer comment: supervision for safety as pt can be impulsive  Ambulation/Gait Ambulation/Gait assistance: Supervision Ambulation Distance (Feet): 200 Feet Assistive device: Rolling walker (2 wheeled) Gait Pattern/deviations: Step-to pattern;Decreased stride length   Gait velocity interpretation: Below normal speed for age/gender General Gait Details: 2 episodes of partial buckling with pt able to support himself with bil UE. Cues for heel strike on LLE. Pt noted to be inverting right ankle with gait with cues to correct   Stairs Stairs: Yes Stairs assistance: Supervision Stair Management: Two rails;Step to pattern;Forwards Number of Stairs: 3 General stair comments: cues for safety no physical assist needed  Wheelchair  Mobility    Modified Rankin (Stroke Patients Only)       Balance Overall balance assessment: Needs assistance Sitting-balance support: No upper extremity supported;Feet supported Sitting balance-Leahy Scale: Good     t                     Cognition Arousal/Alertness: Awake/alert Behavior During Therapy: WFL for tasks assessed/performed Overall Cognitive Status: Within Functional Limits for tasks assessed Area of Impairment: Safety/judgement;Awareness         Safety/Judgement: Decreased awareness of safety     General Comments: pt putting himself in CPM on arrival rather than waiting for assist    Exercises Total Joint Exercises Heel Slides: AAROM;Seated;Left;10 reps Hip ABduction/ADduction: AROM;Standing;Left;10 reps Straight Leg Raises: AAROM;Left;10 reps;Supine Long Arc Quad: AAROM;Seated;Left;10 reps Goniometric ROM: 5-70    General Comments      Pertinent Vitals/Pain Pain Assessment: 0-10 Pain Score: 2  Pain Location: left knee Pain Descriptors / Indicators: Aching Pain Intervention(s): Repositioned           PT Goals (current goals can now be found in the care plan section) Acute Rehab PT Goals Patient Stated Goal: go home Progress towards PT goals: Progressing toward goals    Frequency  7X/week    PT Plan Current plan remains appropriate    Co-evaluation             End of Session   Activity Tolerance: Patient tolerated treatment well Patient left: in bed;with call bell/phone within reach;with family/visitor present     Time: 1478-2956 PT Time Calculation (min) (ACUTE ONLY): 30 min  Charges:  $Gait Training: 8-22 mins $Therapeutic Exercise: 8-22 mins                    G Codes:      Johnathan Barr, Johnathan Barr 12/19/2014, 12:19 PM Johnathan Barr, PT [phone]

## 2014-12-19 NOTE — Care Management Note (Signed)
Case Management Note  Patient Details  Name: Johnathan Barr MRN: [id number] Date of Birth: 04/07/1960  Subjective/Objective:   54 yr old male admitted for left knee revision of uniknee to a total knee arthroplasty.        Action/Plan: Case manager spoke with patient concerning discharge plan and Home Health needs. Patient was preoperatively setup with [facility], no changes. He has rolling walker and chooses not to use 3in1. Patient's daughter, [name] will assist at discharge.  Expected Discharge Date:  12/20/14            Expected Discharge Plan:   Home with Home Health  In-House Referral:     Discharge planning Services  CM Consult  Post Acute Care Choice:  Home Health Choice offered to:  Patient  DME Arranged:  N/A DME Agency:  NA  HH Arranged:  PT HH Agency:  [facility]  Status of Service:  Completed, signed off  Medicare Important Message Given:    Date Medicare IM Given:    Medicare IM give by:    Date Additional Medicare IM Given:    Additional Medicare Important Message give by:     If discussed at Long Length of Stay Meetings, dates discussed:    Additional Comments:  [name], [name], RN 12/19/2014, 11:26 AM

## 2014-12-19 NOTE — Progress Notes (Signed)
Physical Therapy Treatment Patient Details Name: Johnathan BaltimoreJohn E Barr MRN: [id number] DOB: 09/26/1960 Today's Date: 12/19/2014    History of Present Illness 54 yo admitted for transition of Left unicompartmental knee (09/16/13)  to left TKA (12/18/14). PMHx of DM, HTN, HLD    PT Comments    Pt very stiff with knee ROM today. Pt with dressing changed by Dr.Rowan during session. Pt also states he increased his CPM to 110 yesterday and educated not to push ROM to this extent which Dr.Rowan echoed. Pt educated for precaution, transfer, HEp and function. Encouraged CPM later this morning to loosen knee and will see again this PM.   Follow Up Recommendations  Home health PT     Equipment Recommendations       Recommendations for Other Services       Precautions / Restrictions Precautions Precautions: Knee Restrictions LLE Weight Bearing: Weight bearing as tolerated    Mobility  Bed Mobility Overal bed mobility: Modified Independent             General bed mobility comments: increased time with use of RLE to assist LLE and rail   Transfers Overall transfer level: Needs assistance   Transfers: Sit to/from Stand Sit to Stand: Min guard         General transfer comment: cues for hand and LLE placement, sequence and safety  Ambulation/Gait Ambulation/Gait assistance: Min guard Ambulation Distance (Feet): 50 Feet Assistive device: Rolling walker (2 wheeled) Gait Pattern/deviations: Step-to pattern;Decreased stride length;Decreased step length - left   Gait velocity interpretation: Below normal speed for age/gender General Gait Details: no buckling noted today with decreased stride and increased distance. Cues for safety and stability   Stairs            Wheelchair Mobility    Modified Rankin (Stroke Patients Only)       Balance Overall balance assessment: Needs assistance   Sitting balance-Leahy Scale: Good       Standing balance-Leahy Scale: Poor                      Cognition Arousal/Alertness: Awake/alert Behavior During Therapy: WFL for tasks assessed/performed Overall Cognitive Status: Within Functional Limits for tasks assessed                      Exercises Total Joint Exercises Quad Sets: AROM;Seated;Left;5 reps Heel Slides: AAROM;Left;10 reps;Supine Straight Leg Raises: AAROM;Left;5 reps;Supine    General Comments        Pertinent Vitals/Pain Pain Score: 2  Pain Location: left knee Pain Descriptors / Indicators: Aching Pain Intervention(s): Premedicated before session;Repositioned    Home Living                      Prior Function            PT Goals (current goals can now be found in the care plan section) Progress towards PT goals: Progressing toward goals    Frequency       PT Plan Current plan remains appropriate    Co-evaluation             End of Session Equipment Utilized During Treatment: Gait belt Activity Tolerance: Patient tolerated treatment well Patient left: in chair;with call bell/phone within reach     Time: 0720-0803 PT Time Calculation (min) (ACUTE ONLY): 43 min  Charges:  $Gait Training: 8-22 mins $Therapeutic Activity: 8-22 mins  G CodesToney Sang Beth December 24, 2014, 8:07 AM Delaney Meigs, PT [phone]

## 2014-12-19 NOTE — Clinical Social Work Note (Signed)
CSW consult acknowledged:  Clinical Social Worker received consult in reference to SNF placement. PT currently recommending Home Health.   Clinical Social Worker will sign off for now as social work intervention is no longer needed. Please consult us again if new need arises.  Derenda FennelBashira Mikeala Girdler, MSW, LCSWA [phone] 12/19/2014 7:34 AM

## 2014-12-20 LAB — CBC
HEMATOCRIT: 29.8 % — AB (ref 39.0–52.0)
HEMOGLOBIN: 9.9 g/dL — AB (ref 13.0–17.0)
MCH: 30.8 pg (ref 26.0–34.0)
MCHC: 33.2 g/dL (ref 30.0–36.0)
MCV: 92.8 fL (ref 78.0–100.0)
Platelets: 187 10*3/uL (ref 150–400)
RBC: 3.21 MIL/uL — ABNORMAL LOW (ref 4.22–5.81)
RDW: 13.1 % (ref 11.5–15.5)
WBC: 7.4 10*3/uL (ref 4.0–10.5)

## 2014-12-20 NOTE — Progress Notes (Signed)
PATIENT ID: Johnathan Barr  MRN: [id number]  DOB/AGE:  11/03/1960 / 54 y.o.  2 Days Post-Op Procedure(s) (LRB): REMOVAL LEFT UNI KNEE COMPONENTS/LEFT TOTAL KNEE ARTHROPLASTY  (Left)    PROGRESS NOTE Subjective: Patient is alert, oriented, no Nausea, no Vomiting, yes passing gas, no Bowel Movement. Taking PO well. Denies SOB, Chest or Calf Pain. Using Incentive Spirometer, PAS in place. Ambulate WABT with pt walking 200 ft, CPM over night. Patient reports pain as moderate  .    Objective: Vital signs in last 24 hours: Filed Vitals:   12/19/14 0450 12/19/14 1300 12/19/14 2143 12/20/14 0533  BP: 95/61 103/58 111/57 122/68  Pulse: 116 87 95 90  Temp: 100.1 F (37.8 C) 99.4 F (37.4 C) 99.3 F (37.4 C) 99.1 F (37.3 C)  TempSrc: Oral Oral Oral Oral  Resp: 20 20 18 20   Height:      Weight:      SpO2: 95% 97% 96% 98%      Intake/Output from previous day: I/O last 3 completed shifts: In: 1200 [P.O.:1200] Out: 3250 [Urine:2550; Blood:700]   Intake/Output this shift:     LABORATORY DATA:  Recent Labs  12/18/14 0611 12/18/14 1027 12/19/14 0558 12/20/14 0305  WBC  --   --  8.3 7.4  HGB  --   --  10.6* 9.9*  HCT  --   --  32.4* 29.8*  PLT  --   --  214 187  NA  --   --  131*  --   K  --   --  3.8  --   CL  --   --  97*  --   CO2  --   --  28  --   BUN  --   --  11  --   CREATININE  --   --  1.14  --   GLUCOSE  --   --  149*  --   GLUCAP 110* 140*  --   --   CALCIUM  --   --  8.1*  --     Examination: Neurologically intact Neurovascular intact Sensation intact distally Intact pulses distally Dorsiflexion/Plantar flexion intact Incision: moderate drainage No cellulitis present Compartment soft} Pt's dressing was changed and had active oozing that was reinforced with 4x4's.  Assessment:   2 Days Post-Op Procedure(s) (LRB): REMOVAL LEFT UNI KNEE COMPONENTS/LEFT TOTAL KNEE ARTHROPLASTY  (Left) ADDITIONAL DIAGNOSIS: Expected Acute Blood Loss Anemia, Diabetes and  Hypertension  Plan: PT/OT WBAT, CPM 5/hrs day until ROM 0-90 degrees, then D/C CPM DVT Prophylaxis:  SCDx72hrs, ASA 325 mg BID x 2 weeks DISCHARGE PLAN: Home DISCHARGE NEEDS: HHPT, HHRN, CPM, Walker and 3-in-1 comode seat     Sheila Ocasio R 12/20/2014, 7:57 AM

## 2014-12-20 NOTE — Discharge Summary (Signed)
Patient ID: Johnathan Barr MRN: [id number] DOB/AGE: 56/05/1960 56 y.o.  Admit date: 12/18/2014 Discharge date: 12/20/2014  Admission Diagnoses:  Active Problems:   Arthritis of knee   Discharge Diagnoses:  Same  Past Medical History  Diagnosis Date  . Colon polyps     2  . Osteoarthritis of left knee 09/16/2013  . Hypertension   . Hyperlipidemia   . Hyperthyroidism   . 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 GERD (gastroesophageal reflux disease)   . Diabetes mellitus (HCC)     Type 2  . PVC's (premature ventricular contractions)   . Thoracic aortic aneurysm (HCC)     01/12/14 ectatic to mildly aneurysmal 3.5cm ascending thoracic aorta; needs recheck 2 yrs    Surgeries: Procedure(s): REMOVAL LEFT UNI KNEE COMPONENTS/LEFT TOTAL KNEE ARTHROPLASTY  on 12/18/2014   Consultants:    Discharged Condition: Improved  Hospital Course: Johnathan Barr is an 54 y.o. male who was admitted 12/18/2014 for operative treatment of<principal problem not specified>. Patient has severe unremitting pain that affects sleep, daily activities, and work/hobbies. After pre-op clearance the patient was taken to the operating room on 12/18/2014 and underwent  Procedure(s): REMOVAL LEFT UNI KNEE COMPONENTS/LEFT TOTAL KNEE ARTHROPLASTY .    Patient was given perioperative antibiotics: Anti-infectives    Start     Dose/Rate Route Frequency Ordered Stop   12/18/14 1400  ceFAZolin (ANCEF) IVPB 1 g/50 mL premix     1 g 100 mL/hr over 30 Minutes Intravenous 4 times per day 12/18/14 1219 12/18/14 2335   12/18/14 0813  cefUROXime (ZINACEF) injection  Status:  Discontinued       As needed 12/18/14 0813 12/18/14 1019   12/18/14 0723  vancomycin (VANCOCIN) 1 GM/200ML IVPB    Comments:  Kirt BoysMueller, Thomas   : cabinet override      12/18/14 0723 12/18/14 1929   12/18/14 0600  ceFAZolin (ANCEF) IVPB 2 g/50 mL premix     2 g 100 mL/hr over 30 Minutes Intravenous On call to O.R. 12/17/14 1526 12/18/14  [id number]       Patient was given sequential compression devices, early ambulation, and chemoprophylaxis to prevent DVT.  Patient benefited maximally from hospital stay and there were no complications.    Recent vital signs: Patient Vitals for the past 24 hrs:  BP Temp Temp src Pulse Resp SpO2  12/20/14 0807 (!) 108/58 mmHg 99.6 F (37.6 C) Oral 94 18 96 %  12/20/14 0533 122/68 mmHg 99.1 F (37.3 C) Oral 90 20 98 %  12/19/14 2143 (!) 111/57 mmHg 99.3 F (37.4 C) Oral 95 18 96 %  12/19/14 1300 (!) 103/58 mmHg 99.4 F (37.4 C) Oral 87 20 97 %     Recent laboratory studies:  Recent Labs  12/19/14 0558 12/20/14 0305  WBC 8.3 7.4  HGB 10.6* 9.9*  HCT 32.4* 29.8*  PLT 214 187  NA 131*  --   K 3.8  --   CL 97*  --   CO2 28  --   BUN 11  --   CREATININE 1.14  --   GLUCOSE 149*  --   CALCIUM 8.1*  --      Discharge Medications:     Medication List    STOP taking these medications        cyclobenzaprine 10 MG tablet  Commonly known as:  FLEXERIL     meloxicam 15 MG tablet  Commonly known as:  MOBIC      TAKE these  medications        aspirin EC 325 MG tablet  Take 1 tablet (325 mg total) by mouth 2 (two) times daily.     atorvastatin 40 MG tablet  Commonly known as:  LIPITOR  Take 1 tablet (40 mg total) by mouth daily.     B COMPLEX-B12 PO  Take 1 tablet by mouth daily.     buPROPion 150 MG 24 hr tablet  Commonly known as:  WELLBUTRIN XL  TAKE 1 TABLET BY MOUTH DAILY.     cholecalciferol 1000 UNITS tablet  Commonly known as:  VITAMIN D  Take 1,000 Units by mouth 2 (two) times daily.     Dapagliflozin-Metformin HCl ER 06-998 MG Tb24  Commonly known as:  XIGDUO XR  Take 1 tablet by mouth daily.     FUSION PLUS Caps  Take 1 capsule by mouth daily.     lisinopril 20 MG tablet  Commonly known as:  PRINIVIL,ZESTRIL  Take 1 tablet (20 mg total) by mouth daily.     methocarbamol 500 MG tablet  Commonly known as:  ROBAXIN  Take 1 tablet (500 mg total) by  mouth 2 (two) times daily with a meal.     OVER THE COUNTER MEDICATION  Take 4 capsules by mouth at bedtime. Amino Acid Complex     oxyCODONE-acetaminophen 5-325 MG tablet  Commonly known as:  ROXICET  Take 1 tablet by mouth every 4 (four) hours as needed.     pantoprazole 20 MG tablet  Commonly known as:  PROTONIX  Take 2 tablets (40 mg total) by mouth daily.     vitamin C 500 MG tablet  Commonly known as:  ASCORBIC ACID  Take 500 mg by mouth 2 (two) times daily.        Diagnostic Studies: Dg Chest 2 View  12/07/2014  CLINICAL DATA:  Preoperative radiograph. EXAM: CHEST  2 VIEW COMPARISON:  CT of the chest dated 01/12/2014 FINDINGS: Cardiomediastinal silhouette is normal. Mediastinal contours appear intact. Tortuosity of the aorta is noted. There is no evidence of focal airspace consolidation, pleural effusion or pneumothorax. Osseous structures are without acute abnormality. Multilevel osteoarthritic changes of the thoracic spine with remodeling of the thoracic vertebral bodies are noted. Soft tissues are grossly normal. IMPRESSION: No radiographic evidence of acute cardiopulmonary abnormality. Electronically Signed   By: Ted Mcalpine M.D.   On: 12/07/2014 16:43    Disposition: 06-Home-Health Care Svc      Discharge Instructions    CPM    Complete by:  As directed   Continuous passive motion machine (CPM):      Use the CPM from 0 to 60  for 5 hours per day.      You may increase by 10 degrees per day.  You may break it up into 2 or 3 sessions per day.      Use CPM for 2 weeks or until you are told to stop.     Call MD / Call 911    Complete by:  As directed   If you experience chest pain or shortness of breath, CALL 911 and be transported to the hospital emergency room.  If you develope a fever above 101 F, pus (white drainage) or increased drainage or redness at the wound, or calf pain, call your surgeon's office.     Change dressing    Complete by:  As directed    Change dressing on 5, then change the dressing daily with sterile 4 x  4 inch gauze dressing and apply TED hose.  You may clean the incision with alcohol prior to redressing.     Constipation Prevention    Complete by:  As directed   Drink plenty of fluids.  Prune juice may be helpful.  You may use a stool softener, such as Colace (over the counter) 100 mg twice a day.  Use MiraLax (over the counter) for constipation as needed.     Diet - low sodium heart healthy    Complete by:  As directed      Discharge instructions    Complete by:  As directed   Follow up in office with Dr. Turner Daniels in 2 weeks.     Driving restrictions    Complete by:  As directed   No driving for 2 weeks     Increase activity slowly as tolerated    Complete by:  As directed      Patient may shower    Complete by:  As directed   You may shower without a dressing once there is no drainage.  Do not wash over the wound.  If drainage remains, cover wound with plastic wrap and then shower.           Follow-up Information    Follow up with Nestor Lewandowsky, MD In 2 weeks.   Specialty:  Orthopedic Surgery   Contact information:   [address] [place] [address] [phone]       Follow up with [facility].   Why:  Someone from [facility] will contact you concerning start date and time for therapy.   Contact information:   [address] [place] [address] [phone]        Signed: Henry Russel 12/20/2014, 12:25 PM

## 2014-12-20 NOTE — Progress Notes (Signed)
Physical Therapy Treatment Patient Details Name: Johnathan Barr MRN: [id number] DOB: 1960/05/08 Today's Date: 12/20/2014    History of Present Illness 54 yo admitted for transition of Left unicompartmental knee (09/16/13)  to left TKA (12/18/14). PMHx of DM, HTN, HLD    PT Comments    Pt progressing towards goals, ambulated without LLE buckling today, worked on phases of gait and pt to continue to work on this at home. Ambulated 150' with RW and supervision. PT will continue to follow.   Follow Up Recommendations  Home health PT     Equipment Recommendations  None recommended by PT    Recommendations for Other Services       Precautions / Restrictions Precautions Precautions: Knee Precaution Comments: discussed positioning and pt sitting with heel on foam working on extension Restrictions Weight Bearing Restrictions: Yes LLE Weight Bearing: Weight bearing as tolerated    Mobility  Bed Mobility               General bed mobility comments: Pt in chair upon arrival  Transfers Overall transfer level: Modified independent Equipment used: Rolling walker (2 wheeled) Transfers: Sit to/from Stand Sit to Stand: Modified independent (Device/Increase time)         General transfer comment: pt stood safely today with appropriate use of hands  Ambulation/Gait Ambulation/Gait assistance: Supervision Ambulation Distance (Feet): 150 Feet Assistive device: Rolling walker (2 wheeled) Gait Pattern/deviations: Step-through pattern;Decreased weight shift to left Gait velocity: decreased Gait velocity interpretation: Below normal speed for age/gender General Gait Details: decreased knee flexion with swing through, worked on not circumducting. Decreased heel strike at stance phase as pt with poor quad activation. Also noted, right ankle inversion and genu varus, and discussed corrective shoe wear (pt has orthotics at home). No buckling of LLE during ambulation today   Stairs             Wheelchair Mobility    Modified Rankin (Stroke Patients Only)       Balance Overall balance assessment: Needs assistance Sitting-balance support: No upper extremity supported;Feet supported Sitting balance-Leahy Scale: Good     Standing balance support: No upper extremity supported Standing balance-Leahy Scale: Good Standing balance comment: pt able to stand without support and reach short distances                    Cognition Arousal/Alertness: Awake/alert Behavior During Therapy: WFL for tasks assessed/performed Overall Cognitive Status: Within Functional Limits for tasks assessed                      Exercises Total Joint Exercises Ankle Circles/Pumps: AROM;Both;10 reps;Seated Quad Sets: AROM;Both;10 reps;Seated Gluteal Sets: Both;10 reps;Seated;AROM Heel Slides: AROM;Left;10 reps;Seated Hip ABduction/ADduction: AAROM;Left;10 reps;Seated Straight Leg Raises: AAROM;Left;5 reps;Seated Long Arc Quad: AAROM;Left;10 reps;Seated Knee Flexion: AAROM;Left;5 reps;Seated Goniometric ROM: 10-70    General Comments General comments (skin integrity, edema, etc.): discussed d/c issues including car transfer and supervision at home by daughter.       Pertinent Vitals/Pain Pain Assessment: Faces Faces Pain Scale: Hurts little more Pain Location: posterior left knee Pain Intervention(s): Repositioned;Limited activity within patient's tolerance    Home Living                      Prior Function            PT Goals (current goals can now be found in the care plan section) Acute Rehab PT Goals Patient Stated Goal: return to cycling  PT Goal Formulation: With patient Time For Goal Achievement: 01/01/15 Potential to Achieve Goals: Good Progress towards PT goals: Progressing toward goals    Frequency  7X/week    PT Plan Current plan remains appropriate    Co-evaluation             End of Session Equipment Utilized During  Treatment: Gait belt Activity Tolerance: Patient tolerated treatment well Patient left: in chair;with call bell/phone within reach     Time: 1153-1223 PT Time Calculation (min) (ACUTE ONLY): 30 min  Charges:  $Gait Training: 8-22 mins $Therapeutic Exercise: 8-22 mins                    G Codes:     [name], PT  Acute Rehab Services  [phone]  [name], [name] 12/20/2014, 1:37 PM

## 2014-12-22 LAB — TISSUE CULTURE: Culture: NO GROWTH

## 2014-12-23 LAB — BODY FLUID CULTURE: CULTURE: NO GROWTH

## 2014-12-23 LAB — ANAEROBIC CULTURE

## 2015-01-03 LAB — LIPID PANEL
Cholesterol: 169 mg/dL (ref 125–200)
HDL: 37 mg/dL — ABNORMAL LOW (ref >=40)
LDL Cholesterol: 108 mg/dL (ref <130)
Total CHOL/HDL Ratio: 4.6 Ratio (ref <=5.0)
Triglycerides: 122 mg/dL (ref <150)
VLDL: 24 mg/dL (ref <30)

## 2015-01-03 LAB — LDL CHOLESTEROL, DIRECT: Direct LDL: 111 mg/dL (ref <130)

## 2015-01-05 ENCOUNTER — Telehealth: Payer: Self-pay | Admitting: *Deleted

## 2015-01-05 DIAGNOSIS — I712 Thoracic aortic aneurysm, without rupture, unspecified: Secondary | ICD-10-CM

## 2015-01-05 NOTE — Telephone Encounter (Signed)
Spoke with pt, he is due for CT A of the chest to f/u thoracic aneurysm. Order placed and scheduled for 01/29/15 @ 10 am at the [place] location. Patient voiced understanding of appt time and location.

## 2015-01-13 LAB — FUNGUS CULTURE W SMEAR
Fungal Smear: NONE SEEN
Fungal Smear: NONE SEEN

## 2015-01-29 ENCOUNTER — Ambulatory Visit (HOSPITAL_BASED_OUTPATIENT_CLINIC_OR_DEPARTMENT_OTHER)
Admission: RE | Admit: 2015-01-29 | Discharge: 2015-01-29 | Disposition: A | Discharge status: Home / Self Care | Payer: Managed Care, Other (non HMO) | Source: Ambulatory Visit | Attending: Cardiology | Admitting: Cardiology

## 2015-01-29 ENCOUNTER — Encounter (HOSPITAL_BASED_OUTPATIENT_CLINIC_OR_DEPARTMENT_OTHER): Payer: Self-pay

## 2015-01-29 DIAGNOSIS — I712 Thoracic aortic aneurysm, without rupture, unspecified: Secondary | ICD-10-CM

## 2015-01-29 MED ORDER — IOHEXOL 350 MG/ML SOLN
100.0000 mL | Freq: Once | INTRAVENOUS | Status: AC | PRN
  Administered 2015-01-29: 100 mL via INTRAVENOUS

## 2015-01-30 LAB — AFB CULTURE WITH SMEAR (NOT AT ARMC)
Acid Fast Smear: NONE SEEN
Acid Fast Smear: NONE SEEN

## 2015-02-06 ENCOUNTER — Other Ambulatory Visit: Payer: Self-pay | Admitting: Sports Medicine

## 2015-03-08 ENCOUNTER — Other Ambulatory Visit: Payer: Self-pay | Admitting: Sports Medicine

## 2015-03-12 ENCOUNTER — Ambulatory Visit (INDEPENDENT_AMBULATORY_CARE_PROVIDER_SITE_OTHER): Payer: Managed Care, Other (non HMO) | Admitting: Sports Medicine

## 2015-03-12 VITALS — BP 138/88 | HR 66 | Temp 98.1°F | Resp 16 | Wt 251.5 lb

## 2015-03-12 DIAGNOSIS — E119 Type 2 diabetes mellitus without complications: Secondary | ICD-10-CM

## 2015-03-12 DIAGNOSIS — K219 Gastro-esophageal reflux disease without esophagitis: Secondary | ICD-10-CM

## 2015-03-12 DIAGNOSIS — I10 Essential (primary) hypertension: Secondary | ICD-10-CM

## 2015-03-12 MED ORDER — PANTOPRAZOLE SODIUM 40 MG PO TBEC: 40.0000 mg | DELAYED_RELEASE_TABLET | Freq: Every day | ORAL | Status: DC

## 2015-03-12 MED ORDER — LISINOPRIL 20 MG PO TABS: 20.0000 mg | ORAL_TABLET | Freq: Every day | ORAL | Status: DC

## 2015-03-12 NOTE — Progress Notes (Signed)
  Subjective:    CC:  Follow-up  HPI: Hypertension: Well controlled  Diabetes mellitus type 2: Well-controlled  Total knee arthroplasty: Has now had his revision and doing extremely well.  GERD: Needs a refill on Protonix.  Past medical history, Surgical history, Family history not pertinant except as noted below, Social history, Allergies, and medications have been entered into the medical record, reviewed, and no changes needed.   Review of Systems: No fevers, chills, night sweats, weight loss, chest pain, or shortness of breath.   Objective:    General: Well Developed, well nourished, and in no acute distress.  Neuro: Alert and oriented x3, extra-ocular muscles intact, sensation grossly intact.  HEENT: Normocephalic, atraumatic, pupils equal round reactive to light, neck supple, no masses, no lymphadenopathy, thyroid nonpalpable.  Skin: Warm and dry, no rashes. Cardiac: Regular rate and rhythm, no murmurs rubs or gallops, no lower extremity edema.  Respiratory: Clear to auscultation bilaterally. Not using accessory muscles, speaking in full sentences. Diabetic Foot Exam Both feet were examined, there are no signs of ulceration or abnormal callus. Nails are unremarkable. Dorsalis pedis and posterior tibial pulses are palpable. Sensation is intact to sharp , Mildly hypoesthetic to monofilament testing. Shoes are of appropriate fitment.  Impression and Recommendations:

## 2015-03-12 NOTE — Assessment & Plan Note (Signed)
Refilling pantoprazole.

## 2015-03-12 NOTE — Assessment & Plan Note (Signed)
Well-controlled, refilling medication. 

## 2015-03-12 NOTE — Assessment & Plan Note (Signed)
Well-controlled, foot exam was done today.

## 2015-04-07 ENCOUNTER — Other Ambulatory Visit: Payer: Self-pay | Admitting: Sports Medicine

## 2015-04-12 ENCOUNTER — Other Ambulatory Visit: Payer: Self-pay | Admitting: Sports Medicine

## 2015-05-17 ENCOUNTER — Other Ambulatory Visit: Payer: Self-pay | Admitting: Sports Medicine

## 2015-06-12 ENCOUNTER — Other Ambulatory Visit: Payer: Self-pay | Admitting: Sports Medicine

## 2015-06-20 ENCOUNTER — Other Ambulatory Visit: Payer: Self-pay | Admitting: Sports Medicine

## 2015-07-26 ENCOUNTER — Other Ambulatory Visit: Payer: Self-pay | Admitting: Sports Medicine

## 2015-08-09 ENCOUNTER — Encounter: Payer: Self-pay | Admitting: Sports Medicine

## 2015-08-09 ENCOUNTER — Ambulatory Visit (INDEPENDENT_AMBULATORY_CARE_PROVIDER_SITE_OTHER): Payer: Managed Care, Other (non HMO) | Admitting: Sports Medicine

## 2015-08-09 VITALS — BP 124/76 | HR 70 | Ht 72.0 in | Wt 242.0 lb

## 2015-08-09 DIAGNOSIS — E119 Type 2 diabetes mellitus without complications: Secondary | ICD-10-CM

## 2015-08-09 DIAGNOSIS — I10 Essential (primary) hypertension: Secondary | ICD-10-CM

## 2015-08-09 DIAGNOSIS — K219 Gastro-esophageal reflux disease without esophagitis: Secondary | ICD-10-CM

## 2015-08-09 LAB — POCT GLYCOSYLATED HEMOGLOBIN (HGB A1C): Hemoglobin A1C: 5.8

## 2015-08-09 MED ORDER — PANTOPRAZOLE SODIUM 40 MG PO TBEC: 40.0000 mg | DELAYED_RELEASE_TABLET | Freq: Every day | ORAL | Status: DC

## 2015-08-09 MED ORDER — LISINOPRIL 20 MG PO TABS: ORAL_TABLET | ORAL | Status: DC

## 2015-08-09 MED ORDER — DAPAGLIFLOZIN PRO-METFORMIN ER 5-1000 MG PO TB24: 1.0000 | ORAL_TABLET | Freq: Every day | ORAL | Status: DC

## 2015-08-09 MED ORDER — BUPROPION HCL ER (XL) 150 MG PO TB24: 150.0000 mg | ORAL_TABLET | Freq: Every day | ORAL | Status: DC

## 2015-08-09 NOTE — Assessment & Plan Note (Signed)
Stable and doing well, refilling medication

## 2015-08-09 NOTE — Progress Notes (Signed)
  Subjective:    CC: Follow-up  HPI: Diabetes mellitus type 2: Stable, A1c is 5.8.  Hypertension: Controlled  Hyperlipidemia: Stable  GERD: Stable  Past medical history, Surgical history, Family history not pertinant except as noted below, Social history, Allergies, and medications have been entered into the medical record, reviewed, and no changes needed.   Review of Systems: No fevers, chills, night sweats, weight loss, chest pain, or shortness of breath.   Objective:    General: Well Developed, well nourished, and in no acute distress.  Neuro: Alert and oriented x3, extra-ocular muscles intact, sensation grossly intact.  HEENT: Normocephalic, atraumatic, pupils equal round reactive to light, neck supple, no masses, no lymphadenopathy, thyroid nonpalpable.  Skin: Warm and dry, no rashes. Cardiac: Regular rate and rhythm, no murmurs rubs or gallops, no lower extremity edema.  Respiratory: Clear to auscultation bilaterally. Not using accessory muscles, speaking in full sentences.  Impression and Recommendations:

## 2015-08-09 NOTE — Assessment & Plan Note (Signed)
Stable and doing well, refilling medication 

## 2015-08-10 ENCOUNTER — Ambulatory Visit: Payer: Managed Care, Other (non HMO) | Admitting: Sports Medicine

## 2015-08-13 ENCOUNTER — Other Ambulatory Visit: Payer: Self-pay

## 2015-08-13 ENCOUNTER — Other Ambulatory Visit: Payer: Self-pay | Admitting: Sports Medicine

## 2015-08-13 DIAGNOSIS — E785 Hyperlipidemia, unspecified: Secondary | ICD-10-CM

## 2015-08-13 MED ORDER — ATORVASTATIN CALCIUM 40 MG PO TABS: 40.0000 mg | ORAL_TABLET | Freq: Every day | ORAL | Status: DC

## 2015-08-13 NOTE — Telephone Encounter (Signed)
Sent in 90 day prescription for Lipitor.

## 2015-09-01 ENCOUNTER — Other Ambulatory Visit: Payer: Self-pay | Admitting: Sports Medicine

## 2015-12-26 ENCOUNTER — Encounter: Payer: Self-pay | Admitting: *Deleted

## 2016-01-07 ENCOUNTER — Ambulatory Visit: Payer: Managed Care, Other (non HMO) | Admitting: Cardiology

## 2016-01-07 NOTE — Progress Notes (Signed)
HPI: FU hypertension. Echocardiogram November 2015 showed normal LV function, grade 1 diastolic dysfunction, dilated aortic root at 45 mm, mild mitral regurgitation and tricuspid regurgitation. CTA Dec 2016 showed 3.5 cm descending thoracic aorta. FU recommended one year. Nuclear study October 2016 showed ejection fraction 59%. There was inferior thinning but no ischemia. Since last seen the patient denies any dyspnea on exertion, orthopnea, PND, pedal edema, palpitations, syncope or chest pain.   Current Outpatient Prescriptions  Medication Sig Dispense Refill  . atorvastatin (LIPITOR) 40 MG tablet Take 1 tablet (40 mg total) by mouth daily. 90 tablet 1  . buPROPion (WELLBUTRIN XL) 150 MG 24 hr tablet Take 1 tablet (150 mg total) by mouth daily. 90 tablet 3  . meloxicam (MOBIC) 15 MG tablet TAKE 1 TABLET BY MOUTH EVERY MORNING WITH BREAKFAST FOR 2WKS THEN DAILY AS NEEDED FOR PAIN 90 tablet 3  . OVER THE COUNTER MEDICATION Take 4 capsules by mouth at bedtime. Amino Acid Complex    . pantoprazole (PROTONIX) 40 MG tablet Take 1 tablet (40 mg total) by mouth daily. 90 tablet 3  . XIGDUO XR 06-998 MG TB24 TAKE 1 TABLET BY MOUTH DAILY 30 tablet 0   No current facility-administered medications for this visit.      Past Medical History:  Diagnosis Date  . Colon polyps    2  . Diabetes mellitus (HCC)    Type 2  . GERD (gastroesophageal reflux disease)   . Hyperlipidemia   . Hypertension   . Hyperthyroidism   . 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 Osteoarthritis of left knee 09/16/2013  . PVC's (premature ventricular contractions)   . Thoracic aortic aneurysm (HCC)    01/12/14 ectatic to mildly aneurysmal 3.5cm ascending thoracic aorta; needs recheck 2 yrs    Past Surgical History:  Procedure Laterality Date  . COLONOSCOPY    . I&D EXTREMITY Left 01/08/2014   Procedure: IRRIGATION AND DEBRIDEMENT EXTREMITY LEFT KNEE;  Surgeon: Eulas PostJoshua P Landau, MD;  Location: [facility] OR;   Service: Orthopedics;  Laterality: Left;  . KNEE ARTHROSCOPY W/ MEDIAL COLLATERAL LIGAMENT (MCL) REPAIR  2002   left  . PARTIAL KNEE ARTHROPLASTY Left 09/16/2013   Procedure: LEFT KNEE: ARTHROPLASTY KNEE CONDYLE AND PLATEAU MEDIAL COMPARTMENT;  Surgeon: Eulas PostJoshua P Landau, MD;  Location: [facility];  Service: Orthopedics;  Laterality: Left;  . TOTAL KNEE ARTHROPLASTY WITH REVISION COMPONENTS Left 12/18/2014   Procedure: REMOVAL LEFT UNI KNEE COMPONENTS/LEFT TOTAL KNEE ARTHROPLASTY ;  Surgeon: Gean BirchwoodFrank Rowan, MD;  Location: [facility] OR;  Service: Orthopedics;  Laterality: Left;  . TOTAL KNEE REVISION Left 01/08/2014   Procedure: LEFT KNEE IRRIGATION & DEBRIDEMENT WITH EXCHANGE OF POLYETHALINE ;  Surgeon: Eulas PostJoshua P Landau, MD;  Location: [facility] OR;  Service: Orthopedics;  Laterality: Left;    Social History   Social History  . Marital status: Divorced    Spouse name: N/A  . Number of children: 3  . Years of education: N/A   Occupational History  .      Retail bankerAircraft mechanic   Social History Main Topics  . Smoking status: Current Some Day Smoker    Packs/day: 0.25    Types: E-cigarettes    Last attempt to quit: 09/12/2012  . Smokeless tobacco: Never Used  . Alcohol use 0.0 oz/week     Comment: almost daily  . Drug use: No  . Sexual activity: Yes    Birth control/ protection: None     Comment: vapo  Other Topics Concern  . Not on file   Social History Narrative   You have been given a separate informational sheet regarding your tobacco use, the importance of quitting and local resources to help you quit.          Family History  Problem Relation Age of Onset  . Ovarian cancer Mother   . Heart disease Father     CAD  . Lung cancer Paternal Uncle   . Lymphoma Cousin     ROS: no fevers or chills, productive cough, hemoptysis, dysphasia, odynophagia, melena, hematochezia, dysuria, hematuria, rash, seizure activity, orthopnea, PND, pedal edema, claudication. Remaining systems  are negative.  Physical Exam: Well-developed well-nourished in no acute distress.  Skin is warm and dry.  HEENT is normal.  Neck is supple.  Chest is clear to auscultation with normal expansion.  Cardiovascular exam is regular rate and rhythm.  Abdominal exam nontender or distended. No masses palpated. Extremities show no edema. neuro grossly intact  ECG sinus rhythm at a rate of 75. No ST changes  A/P  1 thoracic aortic aneurysm-plan repeat CTA December 2017.  2 hypertension-blood pressure controlled. Continue present medications.  3 hyperlipidemia-continue statin.   Olga MillersBrian Kaloni Bisaillon, MD

## 2016-01-10 ENCOUNTER — Ambulatory Visit (INDEPENDENT_AMBULATORY_CARE_PROVIDER_SITE_OTHER): Payer: Managed Care, Other (non HMO) | Admitting: Cardiology

## 2016-01-10 ENCOUNTER — Encounter: Payer: Self-pay | Admitting: Cardiology

## 2016-01-10 VITALS — BP 138/80 | HR 75 | Ht 72.0 in | Wt 241.0 lb

## 2016-01-10 DIAGNOSIS — E7800 Pure hypercholesterolemia, unspecified: Secondary | ICD-10-CM

## 2016-01-10 DIAGNOSIS — I10 Essential (primary) hypertension: Secondary | ICD-10-CM

## 2016-01-10 DIAGNOSIS — I712 Thoracic aortic aneurysm, without rupture, unspecified: Secondary | ICD-10-CM

## 2016-01-10 NOTE — Patient Instructions (Addendum)
Medication Instructions:   NO CHANGE  Your physician recommends that you return for lab work PRIOR TO CT SCAN  Testing/Procedures:  Non-Cardiac CT Angiography (CTA), is a special type of CT scan that uses a computer to produce multi-dimensional views of major blood vessels throughout the body. In CT angiography, a contrast material is injected through an IV to help visualize the blood vessels AT THE [place] LOCATION  Follow-Up:  Your physician wants you to follow-up in: ONE YEAR WITH DR Shelda PalRENSHAW You will receive a reminder letter in the mail two months in advance. If you don't receive a letter, please call our office to schedule the follow-up appointment.   If you need a refill on your cardiac medications before your next appointment, please call your pharmacy.

## 2016-01-25 LAB — BASIC METABOLIC PANEL
BUN: 23 mg/dL (ref 7–25)
CALCIUM: 9.2 mg/dL (ref 8.6–10.3)
CO2: 27 mmol/L (ref 20–31)
Chloride: 103 mmol/L (ref 98–110)
Creat: 1.13 mg/dL (ref 0.70–1.33)
GLUCOSE: 117 mg/dL — AB (ref 65–99)
POTASSIUM: 4.2 mmol/L (ref 3.5–5.3)
SODIUM: 139 mmol/L (ref 135–146)

## 2016-02-02 ENCOUNTER — Ambulatory Visit (HOSPITAL_BASED_OUTPATIENT_CLINIC_OR_DEPARTMENT_OTHER): Admission: RE | Admit: 2016-02-02 | Payer: Managed Care, Other (non HMO) | Source: Ambulatory Visit

## 2016-02-03 ENCOUNTER — Ambulatory Visit (HOSPITAL_BASED_OUTPATIENT_CLINIC_OR_DEPARTMENT_OTHER)
Admission: RE | Admit: 2016-02-03 | Discharge: 2016-02-03 | Disposition: A | Discharge status: Home / Self Care | Payer: Managed Care, Other (non HMO) | Source: Ambulatory Visit | Attending: Cardiology | Admitting: Cardiology

## 2016-02-03 ENCOUNTER — Encounter (HOSPITAL_BASED_OUTPATIENT_CLINIC_OR_DEPARTMENT_OTHER): Payer: Self-pay

## 2016-02-03 DIAGNOSIS — I712 Thoracic aortic aneurysm, without rupture, unspecified: Secondary | ICD-10-CM

## 2016-02-03 MED ORDER — IOPAMIDOL (ISOVUE-370) INJECTION 76%
100.0000 mL | Freq: Once | INTRAVENOUS | Status: AC | PRN
  Administered 2016-02-03: 100 mL via INTRAVENOUS

## 2016-03-19 ENCOUNTER — Other Ambulatory Visit: Payer: Self-pay | Admitting: Sports Medicine

## 2016-03-19 DIAGNOSIS — E785 Hyperlipidemia, unspecified: Secondary | ICD-10-CM

## 2016-06-06 IMAGING — CT CT ANGIO CHEST
3 of 6 series · 9 of 30 positions shown · IV contrast (omnipaque)
Comparison: None.

CLINICAL DATA: Dilated aortic root.  Tachycardia.

EXAM:
CT ANGIOGRAPHY CHEST WITH CONTRAST
TECHNIQUE: Multidetector CT imaging of the chest was performed using the
standard protocol during bolus administration of intravenous
contrast. Multiplanar CT image reconstructions and MIPs were
obtained to evaluate the vascular anatomy.
CONTRAST:  125mL OMNIPAQUE IOHEXOL 350 MG/ML SOLN

[Series 4: cta chest · axial · 0.72mm/px · z∈[-280,-165]mm · 3 of 140 slices shown]
[im 47/140  lung]
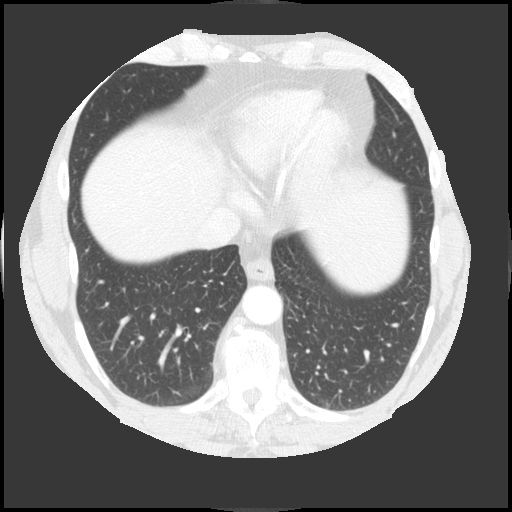
[im 69/140  mediastinal]
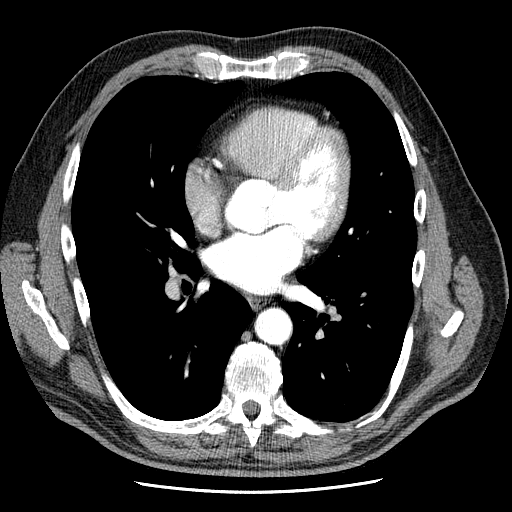
[im 93/140  lung]
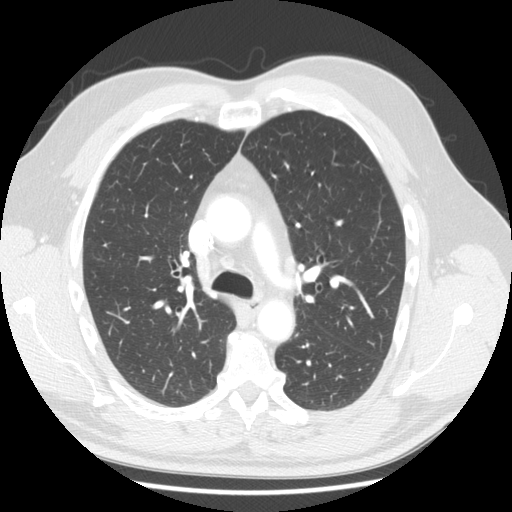

[Series 600: sag · sagittal · 0.72mm/px · 3 of 147 slices shown]
[im 37/147  lung]
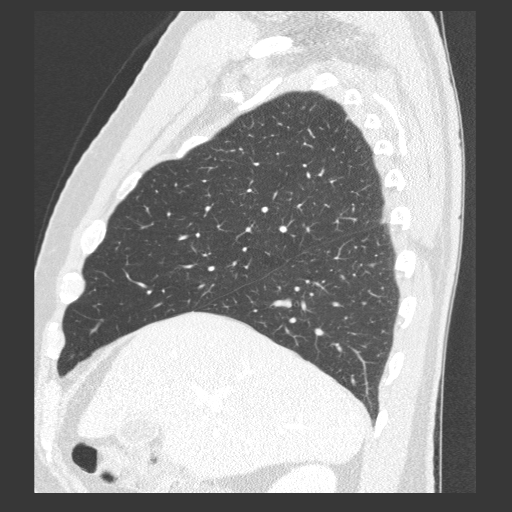
[im 74/147  lung]
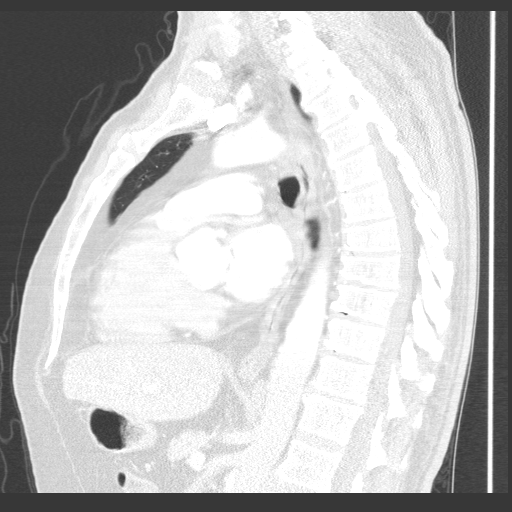
[im 110/147  lung]
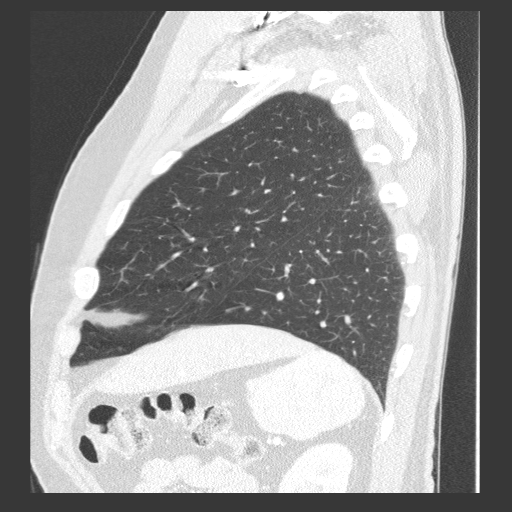

[Series 601: cor · coronal · 0.72mm/px · 3 of 140 slices shown]
[im 35/140  lung]
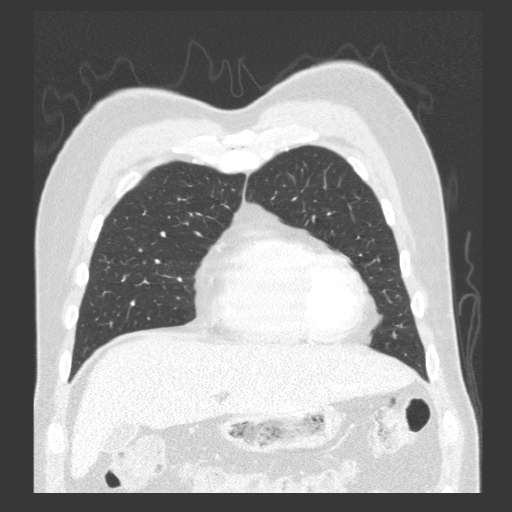
[im 70/140  lung]
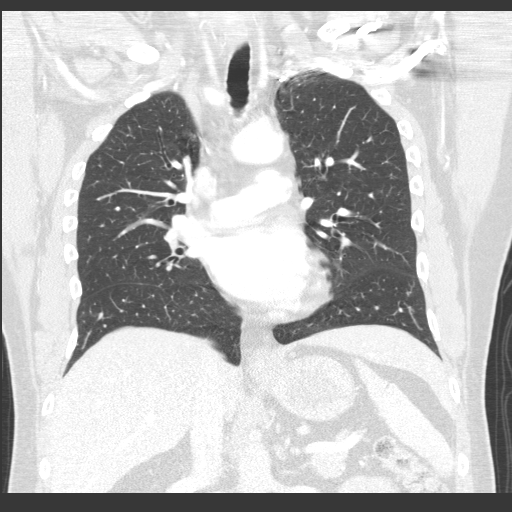
[im 105/140  lung]
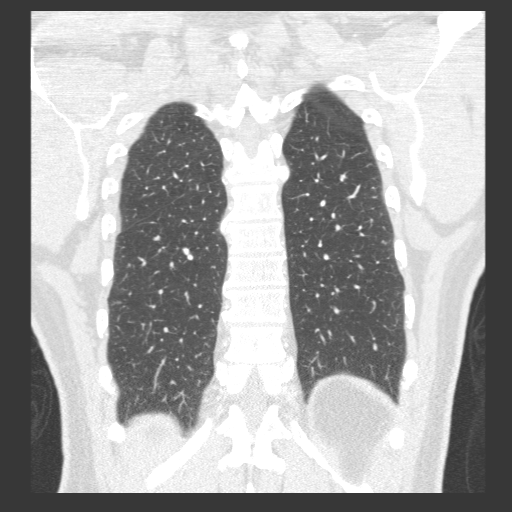

[9 of 30 positions shown; findings below may reference images not displayed]

FINDINGS: Right arm PICC line to the cavoatrial junction. SVC patent. RV/LV
ratio greater than 1. Unremarkable central pulmonary arteries. The
exam was not optimized for detection of peripheral pulmonary emboli.
Patent superior and inferior pulmonary veins bilaterally.

Thoracic aorta measures 3.6 cm diameter at the Gobal
junction. 3.5 cm mid ascending, 3.3 cm distal ascending/ proximal
arch, 2.6 cm distal arch and proximal descending, 2.7 cm distal
descending. No dissection or stenosis. Classic 3 vessel
brachiocephalic arterial origin anatomy without proximal stenosis.
No significant atheromatous irregularity.

No pleural or pericardial effusion. No hilar or mediastinal
adenopathy. Subcentimeter prevascular and precarinal lymph nodes.
Lungs are clear. Minimal spurring at several contiguous levels in
the mid thoracic spine. Sternum intact. Visualized portions of upper
abdomen unremarkable.

Review of the MIP images confirms the above findings.
IMPRESSION: 1. Ectatic to mildly aneurysmal 3.5 cm ascending thoracic aorta.
Recommend annual imaging followup by CTA or MRA. This recommendation
follows 1020 ACCF/AHA/AATS/ACR/ASA/SCA/TOMA/KIN PONG/SEGUNDO ELICEO/RICHELET Guidelines
for the Diagnosis and Management of Patients With Thoracic Aortic
Disease. Circulation.1020; 121: [id number]

## 2016-06-23 ENCOUNTER — Other Ambulatory Visit: Payer: Self-pay

## 2016-06-23 DIAGNOSIS — E785 Hyperlipidemia, unspecified: Secondary | ICD-10-CM

## 2016-06-23 MED ORDER — ATORVASTATIN CALCIUM 40 MG PO TABS: 40.0000 mg | ORAL_TABLET | Freq: Every day | ORAL | 1 refills | Status: DC

## 2016-06-23 MED ORDER — DAPAGLIFLOZIN PRO-METFORMIN ER 5-1000 MG PO TB24: 1.0000 | ORAL_TABLET | Freq: Every day | ORAL | 1 refills | Status: DC

## 2016-06-23 MED ORDER — BUPROPION HCL ER (XL) 150 MG PO TB24: 150.0000 mg | ORAL_TABLET | Freq: Every day | ORAL | 1 refills | Status: DC

## 2016-06-23 MED ORDER — PANTOPRAZOLE SODIUM 40 MG PO TBEC: 40.0000 mg | DELAYED_RELEASE_TABLET | Freq: Every day | ORAL | 1 refills | Status: DC

## 2016-07-01 ENCOUNTER — Encounter: Payer: Self-pay | Admitting: Sports Medicine

## 2016-07-01 ENCOUNTER — Ambulatory Visit (INDEPENDENT_AMBULATORY_CARE_PROVIDER_SITE_OTHER): Payer: Commercial Managed Care - PPO | Admitting: Sports Medicine

## 2016-07-01 DIAGNOSIS — E119 Type 2 diabetes mellitus without complications: Secondary | ICD-10-CM

## 2016-07-01 DIAGNOSIS — W57XXXA Bitten or stung by nonvenomous insect and other nonvenomous arthropods, initial encounter: Secondary | ICD-10-CM | POA: Insufficient documentation

## 2016-07-01 DIAGNOSIS — Z0000 Encounter for general adult medical examination without abnormal findings: Secondary | ICD-10-CM | POA: Diagnosis not present

## 2016-07-01 DIAGNOSIS — E785 Hyperlipidemia, unspecified: Secondary | ICD-10-CM | POA: Diagnosis not present

## 2016-07-01 DIAGNOSIS — W57XXXS Bitten or stung by nonvenomous insect and other nonvenomous arthropods, sequela: Secondary | ICD-10-CM

## 2016-07-01 DIAGNOSIS — I10 Essential (primary) hypertension: Secondary | ICD-10-CM | POA: Diagnosis not present

## 2016-07-01 LAB — LIPID PANEL W/REFLEX DIRECT LDL
Cholesterol: 168 mg/dL (ref <200)
HDL: 40 mg/dL — ABNORMAL LOW (ref >40)
LDL-Cholesterol: 104 mg/dL — ABNORMAL HIGH
Non-HDL Cholesterol (Calc): 128 mg/dL (ref <130)
Total CHOL/HDL Ratio: 4.2 ratio (ref <5.0)
Triglycerides: 147 mg/dL (ref <150)

## 2016-07-01 LAB — COMPREHENSIVE METABOLIC PANEL
ALT: 30 U/L (ref 9–46)
AST: 28 U/L (ref 10–35)
Albumin: 4.7 g/dL (ref 3.6–5.1)
Alkaline Phosphatase: 63 U/L (ref 40–115)
BUN: 21 mg/dL (ref 7–25)
Calcium: 9.5 mg/dL (ref 8.6–10.3)
Creat: 1.13 mg/dL (ref 0.70–1.33)
Sodium: 142 mmol/L (ref 135–146)
Total Bilirubin: 0.9 mg/dL (ref 0.2–1.2)
Total Protein: 7 g/dL (ref 6.1–8.1)

## 2016-07-01 LAB — COMPREHENSIVE METABOLIC PANEL WITH GFR
CO2: 17 mmol/L — ABNORMAL LOW (ref 20–31)
Chloride: 106 mmol/L (ref 98–110)
Glucose, Bld: 96 mg/dL (ref 65–99)
Potassium: 4.4 mmol/L (ref 3.5–5.3)

## 2016-07-01 LAB — CBC
HCT: 46.7 % (ref 38.5–50.0)
Hemoglobin: 15.8 g/dL (ref 13.2–17.1)
MCH: 31.8 pg (ref 27.0–33.0)
MCHC: 33.8 g/dL (ref 32.0–36.0)
MCV: 94 fL (ref 80.0–100.0)
MPV: 9.9 fL (ref 7.5–12.5)
Platelets: 193 K/uL (ref 140–400)
RBC: 4.97 MIL/uL (ref 4.20–5.80)
RDW: 13.2 % (ref 11.0–15.0)
WBC: 5.9 K/uL (ref 3.8–10.8)

## 2016-07-01 LAB — POCT GLYCOSYLATED HEMOGLOBIN (HGB A1C): Hemoglobin A1C: 6.2

## 2016-07-01 MED ORDER — PANTOPRAZOLE SODIUM 40 MG PO TBEC: 40.0000 mg | DELAYED_RELEASE_TABLET | Freq: Every day | ORAL | 1 refills | Status: DC

## 2016-07-01 MED ORDER — BUPROPION HCL ER (XL) 150 MG PO TB24: 150.0000 mg | ORAL_TABLET | Freq: Every day | ORAL | 3 refills | Status: DC

## 2016-07-01 MED ORDER — DAPAGLIFLOZIN PRO-METFORMIN ER 5-1000 MG PO TB24: 1.0000 | ORAL_TABLET | Freq: Every day | ORAL | 3 refills | Status: DC

## 2016-07-01 MED ORDER — MELOXICAM 15 MG PO TABS: 15.0000 mg | ORAL_TABLET | Freq: Every day | ORAL | 3 refills | Status: AC

## 2016-07-01 MED ORDER — ATORVASTATIN CALCIUM 40 MG PO TABS: 40.0000 mg | ORAL_TABLET | Freq: Every day | ORAL | 3 refills | Status: DC

## 2016-07-01 NOTE — Progress Notes (Signed)
  Subjective:    CC: CPE  HPI:  Johnathan RuizJohn is here for his physical, he has no complaints. He refills on all of his medications. He did have a tick bite on the right shin, extension tick was on for more than 24 hours, having some myalgias, this occurred 2 years ago. Desires tickborne serologies.  Past medical history:  Negative.  See flowsheet/record as well for more information.  Surgical history: Negative.  See flowsheet/record as well for more information.  Family history: Negative.  See flowsheet/record as well for more information.  Social history: Negative.  See flowsheet/record as well for more information.  Allergies, and medications have been entered into the medical record, reviewed, and no changes needed.    Review of Systems: No headache, visual changes, nausea, vomiting, diarrhea, constipation, dizziness, abdominal pain, skin rash, fevers, chills, night sweats, swollen lymph nodes, weight loss, chest pain, body aches, joint swelling, muscle aches, shortness of breath, mood changes, visual or auditory hallucinations.  Objective:    General: Well Developed, well nourished, and in no acute distress.  Neuro: Alert and oriented x3, extra-ocular muscles intact, sensation grossly intact. Cranial nerves II through XII are intact, motor, sensory, and coordinative functions are all intact. HEENT: Normocephalic, atraumatic, pupils equal round reactive to light, neck supple, no masses, no lymphadenopathy, thyroid nonpalpable. Oropharynx, nasopharynx, external ear canals are unremarkable. Skin: Warm and dry, no rashes noted.  Cardiac: Regular rate and rhythm, no murmurs rubs or gallops.  Respiratory: Clear to auscultation bilaterally. Not using accessory muscles, speaking in full sentences.  Abdominal: Soft, nontender, nondistended, positive bowel sounds, no masses, no organomegaly.  Musculoskeletal: Shoulder, elbow, wrist, hip, knee, ankle stable, and with full range of motion. Diabetic Foot  Exam Both feet were examined, there are no signs of ulceration or abnormal callus. Nails are unremarkable. Dorsalis pedis and posterior tibial pulses are palpable. Sensation is intact to sharp and monofilament. Shoes are of appropriate fitment.  Impression and Recommendations:    The patient was counselled, risk factors were discussed, anticipatory guidance given.  Diabetes mellitus, type 2 A1c is well controlled, foot exam normal.  Hyperlipidemia with target LDL less than 100 Rechecking lipids  Hypertension Overall well controlled  Annual physical exam Routine physical as above. Return in one year.  Tick bite Some myalgias. Checking tickborne serologies.

## 2016-07-01 NOTE — Assessment & Plan Note (Signed)
A1c is well controlled, foot exam normal.

## 2016-07-01 NOTE — Assessment & Plan Note (Signed)
Routine physical as above. Return in one year. 

## 2016-07-01 NOTE — Assessment & Plan Note (Signed)
Some myalgias. Checking tickborne serologies.

## 2016-07-01 NOTE — Assessment & Plan Note (Signed)
Overall well controlled

## 2016-07-01 NOTE — Assessment & Plan Note (Addendum)
Rechecking lipids.   Some myalgias with atorvastatin, patient stopped this and symptoms went away Switching to pravastatin. It's fairly low potency and has a low incidence of adverse effects.

## 2016-07-02 LAB — LYME AB/WESTERN BLOT REFLEX: B burgdorferi Ab IgG+IgM: <0.9 {index} (ref <0.90)

## 2016-07-03 ENCOUNTER — Encounter: Payer: Self-pay | Admitting: Sports Medicine

## 2016-07-03 LAB — EHRLICHIA ANTIBODY PANEL
E chaffeensis (HGE) Ab, IgG: <1:64 {titer}
E chaffeensis (HGE) Ab, IgM: <1:20 {titer}

## 2016-07-03 LAB — ROCKY MTN SPOTTED FVR ABS PNL(IGG+IGM)
RMSF IgG: NOT DETECTED
RMSF IgM: NOT DETECTED

## 2016-07-07 MED ORDER — PRAVASTATIN SODIUM 40 MG PO TABS: 40.0000 mg | ORAL_TABLET | Freq: Every day | ORAL | 3 refills | Status: DC

## 2016-07-07 NOTE — Addendum Note (Signed)
Addended by: Monica BectonHEKKEKANDAM, THOMAS J on: 07/07/2016 03:01 PM   Modules accepted: Orders

## 2017-03-09 ENCOUNTER — Other Ambulatory Visit: Payer: Self-pay

## 2017-03-09 MED ORDER — DAPAGLIFLOZIN PRO-METFORMIN ER 5-1000 MG PO TB24: 1.0000 | ORAL_TABLET | Freq: Every day | ORAL | 1 refills | Status: DC

## 2017-03-09 MED ORDER — BUPROPION HCL ER (XL) 150 MG PO TB24: 150.0000 mg | ORAL_TABLET | Freq: Every day | ORAL | 1 refills | Status: DC

## 2017-03-09 MED ORDER — PANTOPRAZOLE SODIUM 40 MG PO TBEC: 40.0000 mg | DELAYED_RELEASE_TABLET | Freq: Every day | ORAL | 1 refills | Status: DC

## 2017-07-11 ENCOUNTER — Other Ambulatory Visit: Payer: Self-pay | Admitting: Sports Medicine

## 2017-08-11 ENCOUNTER — Encounter: Payer: Self-pay | Admitting: Sports Medicine

## 2017-08-11 ENCOUNTER — Ambulatory Visit (INDEPENDENT_AMBULATORY_CARE_PROVIDER_SITE_OTHER): Payer: Commercial Managed Care - PPO | Admitting: Sports Medicine

## 2017-08-11 VITALS — BP 155/94 | HR 76 | Ht 72.0 in | Wt 241.0 lb

## 2017-08-11 DIAGNOSIS — I10 Essential (primary) hypertension: Secondary | ICD-10-CM

## 2017-08-11 DIAGNOSIS — L821 Other seborrheic keratosis: Secondary | ICD-10-CM | POA: Diagnosis not present

## 2017-08-11 DIAGNOSIS — E785 Hyperlipidemia, unspecified: Secondary | ICD-10-CM

## 2017-08-11 DIAGNOSIS — L989 Disorder of the skin and subcutaneous tissue, unspecified: Secondary | ICD-10-CM | POA: Diagnosis not present

## 2017-08-11 DIAGNOSIS — E119 Type 2 diabetes mellitus without complications: Secondary | ICD-10-CM

## 2017-08-11 DIAGNOSIS — Z0000 Encounter for general adult medical examination without abnormal findings: Secondary | ICD-10-CM

## 2017-08-11 MED ORDER — LISINOPRIL 20 MG PO TABS
20.0000 mg | ORAL_TABLET | Freq: Every day | ORAL | 3 refills | Status: DC
Start: 2017-08-11 — End: 2017-12-02

## 2017-08-11 NOTE — Assessment & Plan Note (Signed)
Rechecking lipids, patient does not want to take any statins, we can use a fibrate if lipids are elevated.

## 2017-08-11 NOTE — Assessment & Plan Note (Signed)
Routine physical as above. 

## 2017-08-11 NOTE — Assessment & Plan Note (Addendum)
Elevated, patient will work on cutting back his sodium, check his blood pressure at home and let me know the results. He did self discontinue his blood pressure medication, risks of stroke and heart attack explained and understood. Lisinopril 20 mg daily, recheck blood pressure over the next couple of weeks.

## 2017-08-11 NOTE — Progress Notes (Signed)
Subjective:    CC: Annual physical  HPI:  Johnathan RuizJohn is here for his physical.  Diabetes mellitus type 2: Due for some labs, eye exam, foot exam.  Hypertension: Declined treatment, risks and benefits explained.  Hyperlipidemia: Refuses statins, he did have some arthralgias, at this point he does not want to try different statin.  He is agreeable to do fibroids of his lipids are elevated.  Skin lesions: Hyperpigmented on the back, would like me to take a look.  I reviewed the past medical history, family history, social history, surgical history, and allergies today and no changes were needed.  Please see the problem list section below in epic for further details.  Past Medical History: Past Medical History:  Diagnosis Date  . Colon polyps    2  . Diabetes mellitus (HCC)    Type 2  . GERD (gastroesophageal reflux disease)   . Hyperlipidemia   . Hypertension   . Hyperthyroidism   . 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 Osteoarthritis of left knee 09/16/2013  . PVC's (premature ventricular contractions)   . Thoracic aortic aneurysm (HCC)    01/12/14 ectatic to mildly aneurysmal 3.5cm ascending thoracic aorta; needs recheck 2 yrs   Past Surgical History: Past Surgical History:  Procedure Laterality Date  . COLONOSCOPY    . I&D EXTREMITY Left 01/08/2014   Procedure: IRRIGATION AND DEBRIDEMENT EXTREMITY LEFT KNEE;  Surgeon: Eulas PostJoshua P Landau, MD;  Location: [facility];  Service: Orthopedics;  Laterality: Left;  . KNEE ARTHROSCOPY W/ MEDIAL COLLATERAL LIGAMENT (MCL) REPAIR  2002   left  . PARTIAL KNEE ARTHROPLASTY Left 09/16/2013   Procedure: LEFT KNEE: ARTHROPLASTY KNEE CONDYLE AND PLATEAU MEDIAL COMPARTMENT;  Surgeon: Eulas PostJoshua P Landau, MD;  Location: [facility];  Service: Orthopedics;  Laterality: Left;  . TOTAL KNEE ARTHROPLASTY WITH REVISION COMPONENTS Left 12/18/2014   Procedure: REMOVAL LEFT UNI KNEE COMPONENTS/LEFT TOTAL KNEE ARTHROPLASTY ;  Surgeon: Gean BirchwoodFrank  Rowan, MD;  Location: [facility];  Service: Orthopedics;  Laterality: Left;  . TOTAL KNEE REVISION Left 01/08/2014   Procedure: LEFT KNEE IRRIGATION & DEBRIDEMENT WITH EXCHANGE OF POLYETHALINE ;  Surgeon: Eulas PostJoshua P Landau, MD;  Location: [facility];  Service: Orthopedics;  Laterality: Left;   Social History: Social History   Socioeconomic History  . Marital status: Divorced    Spouse name: Not on file  . Number of children: 3  . Years of education: Not on file  . Highest education level: Not on file  Occupational History    Comment: Retail bankerAircraft mechanic  Social Needs  . Financial resource strain: Not on file  . Food insecurity:    Worry: Not on file    Inability: Not on file  . Transportation needs:    Medical: Not on file    Non-medical: Not on file  Tobacco Use  . Smoking status: Current Some Day Smoker    Packs/day: 0.25    Types: E-cigarettes    Last attempt to quit: 09/12/2012    Years since quitting: 4.9  . Smokeless tobacco: Never Used  Substance and Sexual Activity  . Alcohol use: Yes    Alcohol/week: 0.0 oz    Comment: almost daily  . Drug use: No  . Sexual activity: Yes    Birth control/protection: None    Comment: vapo  Lifestyle  . Physical activity:    Days per week: Not on file    Minutes per session: Not on file  . Stress: Not on file  Relationships  .  Social connections:    Talks on phone: Not on file    Gets together: Not on file    Attends religious service: Not on file    Active member of club or organization: Not on file    Attends meetings of clubs or organizations: Not on file    Relationship status: Not on file  Other Topics Concern  . Not on file  Social History Narrative   You have been given a separate informational sheet regarding your tobacco use, the importance of quitting and local resources to help you quit.         Family History: Family History  Problem Relation Age of Onset  . Ovarian cancer Mother   . Heart disease Father        CAD    . Lung cancer Paternal Uncle   . Lymphoma Cousin    Allergies: Allergies  Allergen Reactions  . Vancomycin Hives    "Red man syndrome"   Medications: See med rec.  Review of Systems: No headache, visual changes, nausea, vomiting, diarrhea, constipation, dizziness, abdominal pain, skin rash, fevers, chills, night sweats, swollen lymph nodes, weight loss, chest pain, body aches, joint swelling, muscle aches, shortness of breath, mood changes, visual or auditory hallucinations.  Objective:    General: Well Developed, well nourished, and in no acute distress.  Neuro: Alert and oriented x3, extra-ocular muscles intact, sensation grossly intact. Cranial nerves II through XII are intact, motor, sensory, and coordinative functions are all intact. HEENT: Normocephalic, atraumatic, pupils equal round reactive to light, neck supple, no masses, no lymphadenopathy, thyroid nonpalpable. Oropharynx, nasopharynx, external ear canals are unremarkable. Skin: Warm and dry, no rashes noted.  There are 3 skin lesions, on his back, 2 are seborrheic keratoses, the third does appear to be a variegated, 0.5 cm diameter hyperpigmented nevus with irregular borders. Cardiac: Regular rate and rhythm, no murmurs rubs or gallops.  Respiratory: Clear to auscultation bilaterally. Not using accessory muscles, speaking in full sentences.  Abdominal: Soft, nontender, nondistended, positive bowel sounds, no masses, no organomegaly.  Musculoskeletal: Shoulder, elbow, wrist, hip, knee, ankle stable, and with full range of motion.  Procedure:  Cryodestruction of #2 seborrheic keratoses on the back Consent obtained and verified. Time-out conducted. Noted no overlying erythema, induration, or other signs of local infection. Completed without difficulty using Cryo-Gun. Advised to call if fevers/chills, erythema, induration, drainage, or persistent bleeding.  Procedure: Excision of suspicious nevus on the back, 0.5 cm Risks,  benefits, and alternatives explained and consent obtained. Time out conducted. Surface prepped with alcohol. 2cc lidocaine with epinephine infiltrated in a field block. Adequate anesthesia ensured. Area prepped and draped in a sterile fashion. Excision performed with: Using a 6 mm punch biopsy I took a core sample down into the subcutaneous tissues, I then placed #2 simple interrupted 3-0 Ethilon sutures to close the wound. Hemostasis achieved. Pt stable.  Impression and Recommendations:    The patient was counselled, risk factors were discussed, anticipatory guidance given.  Annual physical exam Routine physical as above.  Hypertension Elevated, patient will work on cutting back his sodium, check his blood pressure at home and let me know the results. He did self discontinue his blood pressure medication, risks of stroke and heart attack explained and understood. Lisinopril 20 mg daily, recheck blood pressure over the next couple of weeks.  Hyperlipidemia with target LDL less than 100 Rechecking lipids, patient does not want to take any statins, we can use a fibrate if lipids are  elevated.  Diabetes mellitus, type 2 Rechecking hemoglobin A1c.  Skin lesion Cryotherapy of #2 skin lesions on the back, excisional punch biopsy of the third. ___________________________________________ Ihor Austin. Benjamin Stain, M.D., ABFM., CAQSM. Primary Care and Sports Medicine [facility]  Adjunct Instructor of Family Medicine  [employer]

## 2017-08-11 NOTE — Assessment & Plan Note (Signed)
Rechecking hemoglobin A1c 

## 2017-08-11 NOTE — Assessment & Plan Note (Signed)
Cryotherapy of #2 skin lesions on the back, excisional punch biopsy of the third.

## 2017-08-21 ENCOUNTER — Encounter: Payer: Self-pay | Admitting: Sports Medicine

## 2017-08-21 ENCOUNTER — Ambulatory Visit: Payer: Commercial Managed Care - PPO | Admitting: Sports Medicine

## 2017-08-21 DIAGNOSIS — E089 Diabetes mellitus due to underlying condition without complications: Secondary | ICD-10-CM | POA: Diagnosis not present

## 2017-08-21 DIAGNOSIS — E785 Hyperlipidemia, unspecified: Secondary | ICD-10-CM | POA: Diagnosis not present

## 2017-08-21 DIAGNOSIS — L989 Disorder of the skin and subcutaneous tissue, unspecified: Secondary | ICD-10-CM

## 2017-08-21 MED ORDER — FENOFIBRATE 160 MG PO TABS
160.0000 mg | ORAL_TABLET | Freq: Every day | ORAL | 3 refills | Status: DC
Start: 2017-08-21 — End: 2018-01-18

## 2017-08-21 NOTE — Assessment & Plan Note (Signed)
Starting fenofibrate, patient declines statins. Recheck lipids in 1 month.

## 2017-08-21 NOTE — Progress Notes (Signed)
Subjective:    CC: Suture removal and other issues  HPI: Skin lesion: Cryotherapy of 2 seborrheic keratoses on the back, excisional biopsy of another, pathology results also showed a simple seborrheic keratosis, ready for suture removal.  Diabetes mellitus type 2: Due for diabetic foot exam.  Hyperlipidemia: Lipids were significant Lee elevated, declined statin, agreeable to do a fiber rate.  I reviewed the past medical history, family history, social history, surgical history, and allergies today and no changes were needed.  Please see the problem list section below in epic for further details.  Past Medical History: Past Medical History:  Diagnosis Date  . Colon polyps    2  . Diabetes mellitus (HCC)    Type 2  . GERD (gastroesophageal reflux disease)   . Hyperlipidemia   . Hypertension   . Hyperthyroidism   . 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 Osteoarthritis of left knee 09/16/2013  . PVC's (premature ventricular contractions)   . Thoracic aortic aneurysm (HCC)    01/12/14 ectatic to mildly aneurysmal 3.5cm ascending thoracic aorta; needs recheck 2 yrs   Past Surgical History: Past Surgical History:  Procedure Laterality Date  . COLONOSCOPY    . I&D EXTREMITY Left 01/08/2014   Procedure: IRRIGATION AND DEBRIDEMENT EXTREMITY LEFT KNEE;  Surgeon: Eulas PostJoshua P Landau, MD;  Location: [facility];  Service: Orthopedics;  Laterality: Left;  . KNEE ARTHROSCOPY W/ MEDIAL COLLATERAL LIGAMENT (MCL) REPAIR  2002   left  . PARTIAL KNEE ARTHROPLASTY Left 09/16/2013   Procedure: LEFT KNEE: ARTHROPLASTY KNEE CONDYLE AND PLATEAU MEDIAL COMPARTMENT;  Surgeon: Eulas PostJoshua P Landau, MD;  Location: [facility];  Service: Orthopedics;  Laterality: Left;  . TOTAL KNEE ARTHROPLASTY WITH REVISION COMPONENTS Left 12/18/2014   Procedure: REMOVAL LEFT UNI KNEE COMPONENTS/LEFT TOTAL KNEE ARTHROPLASTY ;  Surgeon: Gean BirchwoodFrank Rowan, MD;  Location: [facility];  Service: Orthopedics;  Laterality:  Left;  . TOTAL KNEE REVISION Left 01/08/2014   Procedure: LEFT KNEE IRRIGATION & DEBRIDEMENT WITH EXCHANGE OF POLYETHALINE ;  Surgeon: Eulas PostJoshua P Landau, MD;  Location: [facility];  Service: Orthopedics;  Laterality: Left;   Social History: Social History   Socioeconomic History  . Marital status: Divorced    Spouse name: Not on file  . Number of children: 3  . Years of education: Not on file  . Highest education level: Not on file  Occupational History    Comment: Retail bankerAircraft mechanic  Social Needs  . Financial resource strain: Not on file  . Food insecurity:    Worry: Not on file    Inability: Not on file  . Transportation needs:    Medical: Not on file    Non-medical: Not on file  Tobacco Use  . Smoking status: Current Some Day Smoker    Packs/day: 0.25    Types: E-cigarettes    Last attempt to quit: 09/12/2012    Years since quitting: 4.9  . Smokeless tobacco: Never Used  Substance and Sexual Activity  . Alcohol use: Yes    Alcohol/week: 0.0 oz    Comment: almost daily  . Drug use: No  . Sexual activity: Yes    Birth control/protection: None    Comment: vapo  Lifestyle  . Physical activity:    Days per week: Not on file    Minutes per session: Not on file  . Stress: Not on file  Relationships  . Social connections:    Talks on phone: Not on file    Gets together: Not on  file    Attends religious service: Not on file    Active member of club or organization: Not on file    Attends meetings of clubs or organizations: Not on file    Relationship status: Not on file  Other Topics Concern  . Not on file  Social History Narrative   You have been given a separate informational sheet regarding your tobacco use, the importance of quitting and local resources to help you quit.         Family History: Family History  Problem Relation Age of Onset  . Ovarian cancer Mother   . Heart disease Father        CAD  . Lung cancer Paternal Uncle   . Lymphoma Cousin     Allergies: Allergies  Allergen Reactions  . Vancomycin Hives    "Red man syndrome"   Medications: See med rec.  Review of Systems: No fevers, chills, night sweats, weight loss, chest pain, or shortness of breath.   Objective:    General: Well Developed, well nourished, and in no acute distress.  Neuro: Alert and oriented x3, extra-ocular muscles intact, sensation grossly intact.  HEENT: Normocephalic, atraumatic, pupils equal round reactive to light, neck supple, no masses, no lymphadenopathy, thyroid nonpalpable.  Skin: Warm and dry, no rashes.  Incision is clean, dry, intact, both sutures were removed, prior site of cryotherapy looks appropriate. Cardiac: Regular rate and rhythm, no murmurs rubs or gallops, no lower extremity edema.  Respiratory: Clear to auscultation bilaterally. Not using accessory muscles, speaking in full sentences.  Diabetic Foot Exam Both feet were examined, there are no signs of ulceration or abnormal callus. Nails are unremarkable. Dorsalis pedis and posterior tibial pulses are palpable. Sensation is intact to sharp and monofilament. Shoes are of appropriate fitment.  Impression and Recommendations:    Skin lesion Cryotherapy sites look good, sutures removed today. Excisional biopsy was just a seborrheic keratosis.  Hyperlipidemia with target LDL less than 100 Starting fenofibrate, patient declines statins. Recheck lipids in 1 month.  I spent 25 minutes with this patient, greater than 50% was face-to-face time counseling regarding the above diagnoses ___________________________________________ Ihor Austin. Benjamin Stain, M.D., ABFM., CAQSM. Primary Care and Sports Medicine [facility]  Adjunct Instructor of Family Medicine  [employer]

## 2017-08-21 NOTE — Assessment & Plan Note (Signed)
Cryotherapy sites look good, sutures removed today. Excisional biopsy was just a seborrheic keratosis.

## 2017-09-09 ENCOUNTER — Other Ambulatory Visit: Payer: Self-pay

## 2017-09-09 MED ORDER — PANTOPRAZOLE SODIUM 40 MG PO TBEC: 40.0000 mg | DELAYED_RELEASE_TABLET | Freq: Every day | ORAL | 1 refills | Status: DC

## 2017-09-09 MED ORDER — BUPROPION HCL ER (XL) 150 MG PO TB24: 150.0000 mg | ORAL_TABLET | Freq: Every day | ORAL | 1 refills | Status: DC

## 2017-10-03 ENCOUNTER — Other Ambulatory Visit: Payer: Self-pay | Admitting: Sports Medicine

## 2017-11-24 LAB — COMPREHENSIVE METABOLIC PANEL WITH GFR
ALT: 34 U/L (ref 9–46)
AST: 25 U/L (ref 10–35)
BUN: 12 mg/dL (ref 7–25)
Creat: 1.13 mg/dL (ref 0.70–1.33)
Glucose, Bld: 108 mg/dL — ABNORMAL HIGH (ref 65–99)

## 2017-11-24 LAB — COMPREHENSIVE METABOLIC PANEL
AG Ratio: 2 (calc) (ref 1.0–2.5)
Albumin: 4.9 g/dL (ref 3.6–5.1)
Alkaline phosphatase (APISO): 65 U/L (ref 40–115)
CO2: 28 mmol/L (ref 20–32)
Calcium: 10.2 mg/dL (ref 8.6–10.3)
Chloride: 102 mmol/L (ref 98–110)
Globulin: 2.4 g/dL (calc) (ref 1.9–3.7)
Potassium: 4.4 mmol/L (ref 3.5–5.3)
Sodium: 140 mmol/L (ref 135–146)
Total Bilirubin: 0.8 mg/dL (ref 0.2–1.2)
Total Protein: 7.3 g/dL (ref 6.1–8.1)

## 2017-11-24 LAB — LIPID PANEL W/REFLEX DIRECT LDL
Cholesterol: 261 mg/dL — ABNORMAL HIGH (ref <200)
HDL: 44 mg/dL (ref >40)
LDL Cholesterol (Calc): 193 mg/dL — ABNORMAL HIGH
Non-HDL Cholesterol (Calc): 217 mg/dL (calc) — ABNORMAL HIGH (ref <130)
Total CHOL/HDL Ratio: 5.9 (calc) — ABNORMAL HIGH (ref <5.0)
Triglycerides: 114 mg/dL (ref <150)

## 2017-11-24 LAB — CBC
HCT: 48.6 % (ref 38.5–50.0)
Hemoglobin: 16.6 g/dL (ref 13.2–17.1)
MCH: 31.6 pg (ref 27.0–33.0)
MCHC: 34.2 g/dL (ref 32.0–36.0)
MCV: 92.6 fL (ref 80.0–100.0)
MPV: 9.8 fL (ref 7.5–12.5)
Platelets: 218 Thousand/uL (ref 140–400)
RBC: 5.25 10*6/uL (ref 4.20–5.80)
RDW: 13 % (ref 11.0–15.0)
WBC: 6.2 Thousand/uL (ref 3.8–10.8)

## 2017-11-24 LAB — HEMOGLOBIN A1C
Hgb A1c MFr Bld: 6 %{Hb} — ABNORMAL HIGH (ref <5.7)
Mean Plasma Glucose: 126 (calc)
eAG (mmol/L): 7 (calc)

## 2017-11-24 LAB — TSH: TSH: 0.65 mIU/L (ref 0.40–4.50)

## 2017-12-02 ENCOUNTER — Other Ambulatory Visit: Payer: Self-pay | Admitting: Sports Medicine

## 2017-12-02 DIAGNOSIS — I10 Essential (primary) hypertension: Secondary | ICD-10-CM

## 2018-01-01 ENCOUNTER — Other Ambulatory Visit: Payer: Self-pay | Admitting: Sports Medicine

## 2018-01-01 DIAGNOSIS — I10 Essential (primary) hypertension: Secondary | ICD-10-CM

## 2018-01-18 ENCOUNTER — Encounter: Payer: Self-pay | Admitting: Sports Medicine

## 2018-01-18 ENCOUNTER — Ambulatory Visit: Payer: Commercial Managed Care - PPO | Admitting: Sports Medicine

## 2018-01-18 DIAGNOSIS — K219 Gastro-esophageal reflux disease without esophagitis: Secondary | ICD-10-CM | POA: Diagnosis not present

## 2018-01-18 NOTE — Progress Notes (Signed)
Subjective:    CC: Abdominal discomfort  HPI: Mariano is a pleasant 57 year old male, over the past several months he is noted worsening symptoms of food getting stuck in the epigastric region, occasionally he is had to induce vomiting to relieve the symptoms, denies any melena, hematochezia, hematemesis.  It is not associated with any specific foods or consistency of foods.  Looking back at his chart he did have an EGD in 2010 that showed esophageal rings and a mild hiatal hernia.  I reviewed the past medical history, family history, social history, surgical history, and allergies today and no changes were needed.  Please see the problem list section below in epic for further details.  Past Medical History: Past Medical History:  Diagnosis Date  . Colon polyps    2  . Diabetes mellitus (HCC)    Type 2  . GERD (gastroesophageal reflux disease)   . Hyperlipidemia   . Hypertension   . Hyperthyroidism   . 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 Osteoarthritis of left knee 09/16/2013  . PVC's (premature ventricular contractions)   . Thoracic aortic aneurysm (HCC)    01/12/14 ectatic to mildly aneurysmal 3.5cm ascending thoracic aorta; needs recheck 2 yrs   Past Surgical History: Past Surgical History:  Procedure Laterality Date  . COLONOSCOPY    . I&D EXTREMITY Left 01/08/2014   Procedure: IRRIGATION AND DEBRIDEMENT EXTREMITY LEFT KNEE;  Surgeon: [name], MD;  Location: [facility];  Service: Orthopedics;  Laterality: Left;  . KNEE ARTHROSCOPY W/ MEDIAL COLLATERAL LIGAMENT (MCL) REPAIR  2002   left  . PARTIAL KNEE ARTHROPLASTY Left 09/16/2013   Procedure: LEFT KNEE: ARTHROPLASTY KNEE CONDYLE AND PLATEAU MEDIAL COMPARTMENT;  Surgeon: [name], MD;  Location: [facility];  Service: Orthopedics;  Laterality: Left;  . TOTAL KNEE ARTHROPLASTY WITH REVISION COMPONENTS Left 12/18/2014   Procedure: REMOVAL LEFT UNI KNEE COMPONENTS/LEFT TOTAL KNEE ARTHROPLASTY ;   Surgeon: Gean Birchwood, MD;  Location: [facility];  Service: Orthopedics;  Laterality: Left;  . TOTAL KNEE REVISION Left 01/08/2014   Procedure: LEFT KNEE IRRIGATION & DEBRIDEMENT WITH EXCHANGE OF POLYETHALINE ;  Surgeon: [name], MD;  Location: [facility];  Service: Orthopedics;  Laterality: Left;   Social History: Social History   Socioeconomic History  . Marital status: Divorced    Spouse name: Not on file  . Number of children: 3  . Years of education: Not on file  . Highest education level: Not on file  Occupational History    Comment: Retail banker  Social Needs  . Financial resource strain: Not on file  . Food insecurity:    Worry: Not on file    Inability: Not on file  . Transportation needs:    Medical: Not on file    Non-medical: Not on file  Tobacco Use  . Smoking status: Current Some Day Smoker    Packs/day: 0.25    Types: E-cigarettes    Last attempt to quit: 09/12/2012    Years since quitting: 5.3  . Smokeless tobacco: Never Used  Substance and Sexual Activity  . Alcohol use: Yes    Alcohol/week: 0.0 standard drinks    Comment: almost daily  . Drug use: No  . Sexual activity: Yes    Birth control/protection: None    Comment: vapo  Lifestyle  . Physical activity:    Days per week: Not on file    Minutes per session: Not on file  . Stress: Not on file  Relationships  . Social connections:    Talks on phone: Not on file    Gets together: Not on file    Attends religious service: Not on file    Active member of club or organization: Not on file    Attends meetings of clubs or organizations: Not on file    Relationship status: Not on file  Other Topics Concern  . Not on file  Social History Narrative   You have been given a separate informational sheet regarding your tobacco use, the importance of quitting and local resources to help you quit.         Family History: Family History  Problem Relation Age of Onset  . Ovarian cancer Mother   . Heart  disease Father        CAD  . Lung cancer Paternal Uncle   . Lymphoma Cousin    Allergies: Allergies  Allergen Reactions  . Vancomycin Hives    "Red man syndrome"   Medications: See med rec.  Review of Systems: No fevers, chills, night sweats, weight loss, chest pain, or shortness of breath.   Objective:    General: Well Developed, well nourished, and in no acute distress.  Neuro: Alert and oriented x3, extra-ocular muscles intact, sensation grossly intact.  HEENT: Normocephalic, atraumatic, pupils equal round reactive to light, neck supple, no masses, no lymphadenopathy, thyroid nonpalpable.  Skin: Warm and dry, no rashes. Cardiac: Regular rate and rhythm, no murmurs rubs or gallops, no lower extremity edema.  Respiratory: Clear to auscultation bilaterally. Not using accessory muscles, speaking in full sentences. Abdomen: Soft, nontender, nondistended, no bowel sounds, no palpable masses, guarding, rigidity, rebound tenderness.  Impression and Recommendations:    GERD (gastroesophageal reflux disease) Historically well controlled on Protonix, has noted more and more episodes of food getting stuck in his epigastrium, it does not matter the type of food, but typically occurs with overeating. He did have an EGD in 2010 that showed Schatzki rings and a small hiatal hernia. Considering crescendo of symptoms I would like him evaluated by Dr. Barron Alvineirigliano. Adding an ultrasound to complete the work-up in the meantime. ___________________________________________ Ihor Austinhomas J. Benjamin Stainhekkekandam, M.D., ABFM., CAQSM. Primary Care and Sports Medicine [facility]  Adjunct Professor of Family Medicine  [employer]

## 2018-01-18 NOTE — Assessment & Plan Note (Signed)
Historically well controlled on Protonix, has noted more and more episodes of food getting stuck in his epigastrium, it does not matter the type of food, but typically occurs with overeating. He did have an EGD in 2010 that showed Schatzki rings and a small hiatal hernia. Considering crescendo of symptoms I would like him evaluated by Dr. Barron Alvineirigliano. Adding an ultrasound to complete the work-up in the meantime.

## 2018-01-19 ENCOUNTER — Ambulatory Visit (INDEPENDENT_AMBULATORY_CARE_PROVIDER_SITE_OTHER): Payer: Commercial Managed Care - PPO

## 2018-01-19 DIAGNOSIS — R1013 Epigastric pain: Secondary | ICD-10-CM

## 2018-01-19 DIAGNOSIS — K219 Gastro-esophageal reflux disease without esophagitis: Secondary | ICD-10-CM

## 2018-01-20 ENCOUNTER — Encounter: Payer: Self-pay | Admitting: Sports Medicine

## 2018-01-31 ENCOUNTER — Other Ambulatory Visit: Payer: Self-pay | Admitting: Sports Medicine

## 2018-01-31 DIAGNOSIS — I10 Essential (primary) hypertension: Secondary | ICD-10-CM

## 2018-02-01 ENCOUNTER — Encounter: Payer: Self-pay | Admitting: Gastroenterology

## 2018-02-21 ENCOUNTER — Other Ambulatory Visit: Payer: Self-pay | Admitting: Sports Medicine

## 2018-03-02 ENCOUNTER — Other Ambulatory Visit: Payer: Self-pay | Admitting: Sports Medicine

## 2018-03-02 DIAGNOSIS — I10 Essential (primary) hypertension: Secondary | ICD-10-CM

## 2018-03-04 ENCOUNTER — Encounter: Payer: Self-pay | Admitting: Sports Medicine

## 2018-03-05 ENCOUNTER — Ambulatory Visit: Payer: Commercial Managed Care - PPO | Admitting: Gastroenterology

## 2018-03-08 ENCOUNTER — Ambulatory Visit (INDEPENDENT_AMBULATORY_CARE_PROVIDER_SITE_OTHER): Payer: Commercial Managed Care - PPO | Admitting: Sports Medicine

## 2018-03-08 ENCOUNTER — Encounter: Payer: Self-pay | Admitting: Sports Medicine

## 2018-03-08 DIAGNOSIS — L821 Other seborrheic keratosis: Secondary | ICD-10-CM | POA: Diagnosis not present

## 2018-03-08 NOTE — Progress Notes (Signed)
Subjective:    CC: Mole  HPI: This is a pleasant 58 year old male, for the past several years he has had a mole on his left temple but more recently has noted evolving, getting somewhat bigger and more raised.  He has tried squeezing it and picking at it, and is unable to get it to go away.  I reviewed the past medical history, family history, social history, surgical history, and allergies today and no changes were needed.  Please see the problem list section below in epic for further details.  Past Medical History: Past Medical History:  Diagnosis Date  . Colon polyps    2  . Diabetes mellitus (HCC)    Type 2  . GERD (gastroesophageal reflux disease)   . Hyperlipidemia   . Hypertension   . Hyperthyroidism   . 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 Osteoarthritis of left knee 09/16/2013  . PVC's (premature ventricular contractions)   . Thoracic aortic aneurysm (HCC)    01/12/14 ectatic to mildly aneurysmal 3.5cm ascending thoracic aorta; needs recheck 2 yrs   Past Surgical History: Past Surgical History:  Procedure Laterality Date  . COLONOSCOPY    . I&D EXTREMITY Left 01/08/2014   Procedure: IRRIGATION AND DEBRIDEMENT EXTREMITY LEFT KNEE;  Surgeon: [name], MD;  Location: [facility] OR;  Service: Orthopedics;  Laterality: Left;  . KNEE ARTHROSCOPY W/ MEDIAL COLLATERAL LIGAMENT (MCL) REPAIR  2002   left  . PARTIAL KNEE ARTHROPLASTY Left 09/16/2013   Procedure: LEFT KNEE: ARTHROPLASTY KNEE CONDYLE AND PLATEAU MEDIAL COMPARTMENT;  Surgeon: [name], MD;  Location: [facility];  Service: Orthopedics;  Laterality: Left;  . TOTAL KNEE ARTHROPLASTY WITH REVISION COMPONENTS Left 12/18/2014   Procedure: REMOVAL LEFT UNI KNEE COMPONENTS/LEFT TOTAL KNEE ARTHROPLASTY ;  Surgeon: Gean Birchwood, MD;  Location: [facility];  Service: Orthopedics;  Laterality: Left;  . TOTAL KNEE REVISION Left 01/08/2014   Procedure: LEFT KNEE IRRIGATION & DEBRIDEMENT WITH EXCHANGE  OF POLYETHALINE ;  Surgeon: [name], MD;  Location: [facility];  Service: Orthopedics;  Laterality: Left;   Social History: Social History   Socioeconomic History  . Marital status: Divorced    Spouse name: Not on file  . Number of children: 3  . Years of education: Not on file  . Highest education level: Not on file  Occupational History    Comment: Retail banker  Social Needs  . Financial resource strain: Not on file  . Food insecurity:    Worry: Not on file    Inability: Not on file  . Transportation needs:    Medical: Not on file    Non-medical: Not on file  Tobacco Use  . Smoking status: Current Some Day Smoker    Packs/day: 0.25    Types: E-cigarettes    Last attempt to quit: 09/12/2012    Years since quitting: 5.4  . Smokeless tobacco: Never Used  Substance and Sexual Activity  . Alcohol use: Yes    Alcohol/week: 0.0 standard drinks    Comment: almost daily  . Drug use: No  . Sexual activity: Yes    Birth control/protection: None    Comment: vapo  Lifestyle  . Physical activity:    Days per week: Not on file    Minutes per session: Not on file  . Stress: Not on file  Relationships  . Social connections:    Talks on phone: Not on file    Gets together: Not on file  Attends religious service: Not on file    Active member of club or organization: Not on file    Attends meetings of clubs or organizations: Not on file    Relationship status: Not on file  Other Topics Concern  . Not on file  Social History Narrative   You have been given a separate informational sheet regarding your tobacco use, the importance of quitting and local resources to help you quit.         Family History: Family History  Problem Relation Age of Onset  . Ovarian cancer Mother   . Heart disease Father        CAD  . Lung cancer Paternal Uncle   . Lymphoma Cousin    Allergies: Allergies  Allergen Reactions  . Vancomycin Hives    "Red man syndrome"   Medications:  See med rec.  Review of Systems: No fevers, chills, night sweats, weight loss, chest pain, or shortness of breath.   Objective:    General: Well Developed, well nourished, and in no acute distress.  Neuro: Alert and oriented x3, extra-ocular muscles intact, sensation grossly intact.  HEENT: Normocephalic, atraumatic, pupils equal round reactive to light, neck supple, no masses, no lymphadenopathy, thyroid nonpalpable.  Skin: Warm and dry, no rashes.  1 cm seborrheic keratosis on the left temple Cardiac: Regular rate and rhythm, no murmurs rubs or gallops, no lower extremity edema.  Respiratory: Clear to auscultation bilaterally. Not using accessory muscles, speaking in full sentences.  Procedure:  Cryodestruction of 1 cm left temporal seborrheic keratosis Consent obtained and verified. Time-out conducted. Noted no overlying erythema, induration, or other signs of local infection. Completed without difficulty using Cryo-Gun. Advised to call if fevers/chills, erythema, induration, drainage, or persistent bleeding.  Impression and Recommendations:    Seborrheic keratosis Left temple, cryotherapy as above. If persistent we can do repeat cryotherapy in a week or 2. ___________________________________________ Johnathan Barr. Benjamin Stain, M.D., ABFM., CAQSM. Primary Care and Sports Medicine [facility]  Adjunct Professor of Family Medicine  [employer]

## 2018-03-08 NOTE — Assessment & Plan Note (Signed)
Left temple, cryotherapy as above. If persistent we can do repeat cryotherapy in a week or 2.

## 2018-03-17 ENCOUNTER — Encounter: Payer: Self-pay | Admitting: Sports Medicine

## 2018-04-09 ENCOUNTER — Other Ambulatory Visit: Payer: Self-pay | Admitting: Sports Medicine

## 2018-04-09 DIAGNOSIS — I10 Essential (primary) hypertension: Secondary | ICD-10-CM

## 2018-04-09 MED ORDER — DAPAGLIFLOZIN PRO-METFORMIN ER 5-1000 MG PO TB24: 1.0000 | ORAL_TABLET | Freq: Every day | ORAL | 3 refills | Status: AC

## 2018-04-09 MED ORDER — LISINOPRIL 20 MG PO TABS: 20.0000 mg | ORAL_TABLET | Freq: Every day | ORAL | 3 refills | Status: AC

## 2019-03-14 ENCOUNTER — Telehealth: Payer: Self-pay | Admitting: Audiology
